# Patient Record
Sex: Female | Born: 1965 | Race: White | Hispanic: No | Marital: Married | Smoking: Former smoker
Health system: Southern US, Community
[De-identification: ages and names within clinical notes are randomized; demographics above are authoritative.]

## PROBLEM LIST (undated history)

## (undated) DIAGNOSIS — I10 Essential (primary) hypertension: Secondary | ICD-10-CM

## (undated) DIAGNOSIS — I7121 Aneurysm of the ascending aorta, without rupture: Secondary | ICD-10-CM

## (undated) DIAGNOSIS — E785 Hyperlipidemia, unspecified: Secondary | ICD-10-CM

## (undated) HISTORY — PX: TUBAL LIGATION: SHX77

## (undated) HISTORY — DX: Aneurysm of the ascending aorta, without rupture: I71.21

## (undated) HISTORY — DX: Essential (primary) hypertension: I10

## (undated) HISTORY — DX: Hyperlipidemia, unspecified: E78.5

## (undated) HISTORY — PX: NASAL SINUS SURGERY: SHX719

---

## 2005-04-14 ENCOUNTER — Ambulatory Visit: Payer: Self-pay | Admitting: Obstetrics and Gynecology

## 2005-11-22 ENCOUNTER — Ambulatory Visit: Payer: Self-pay | Admitting: Gastroenterology

## 2006-04-20 ENCOUNTER — Ambulatory Visit: Payer: Self-pay | Admitting: Obstetrics and Gynecology

## 2007-05-01 IMAGING — US ABDOMEN ULTRASOUND
1 series · 17 of 25 positions shown · non-contrast
Comparison: none

REASON FOR EXAM: RUQ pain  RLQ pain
COMMENTS:

[Series 1: abdomen ultrasound · 17 of 55 slices shown]
[im 1/55]
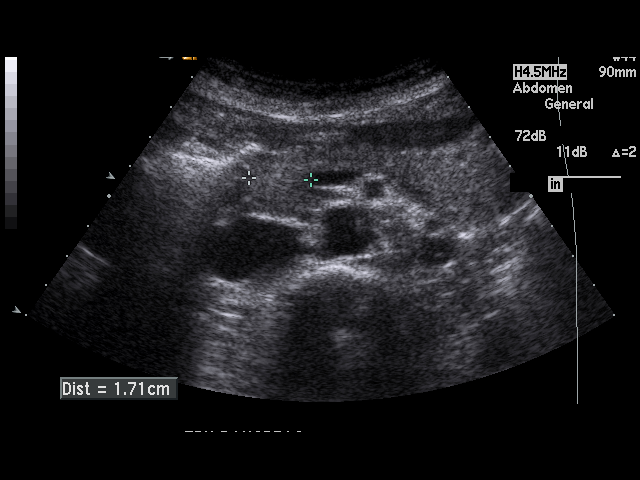
[im 5/55]
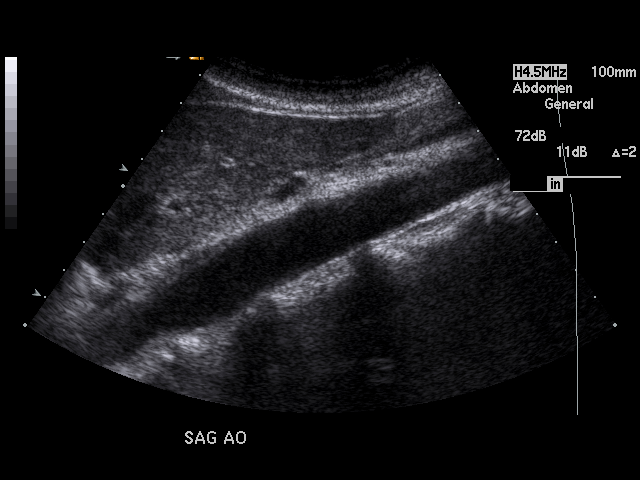
[im 7/55]
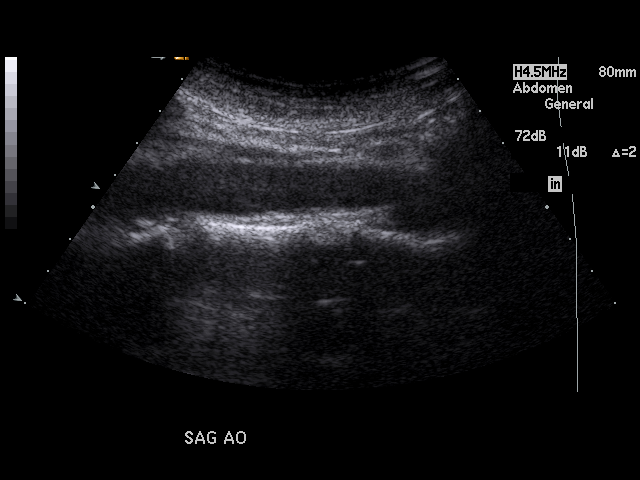
[im 12/55]
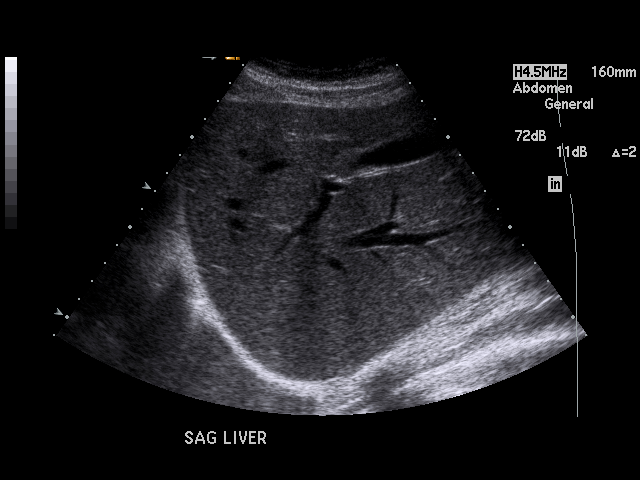
[im 14/55]
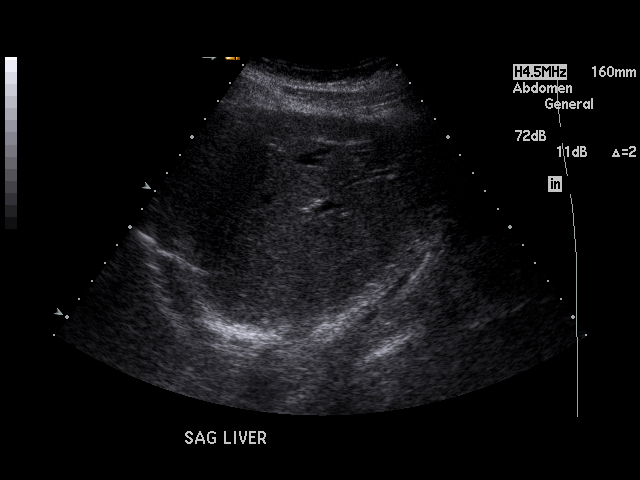
[im 19/55]
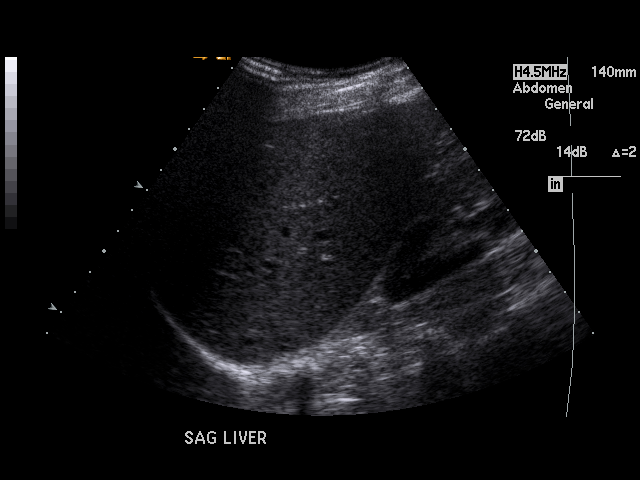
[im 21/55]
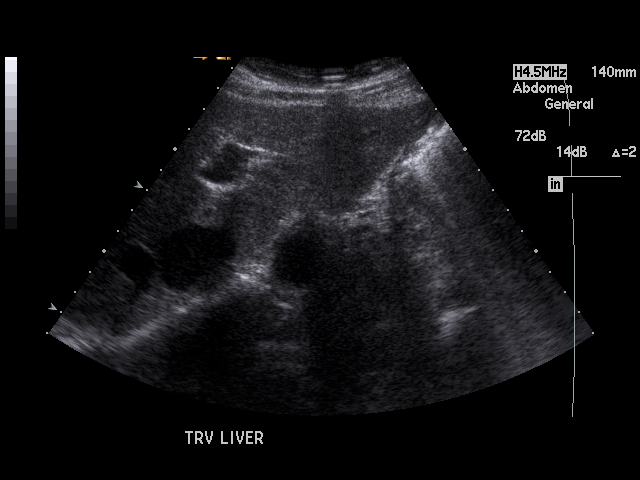
[im 25/55]
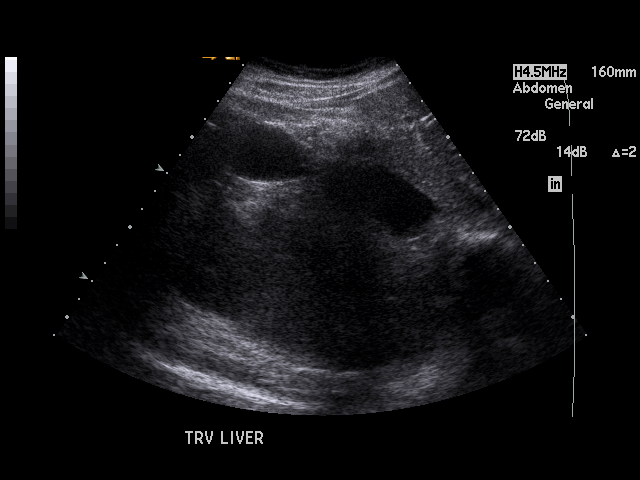
[im 28/55]
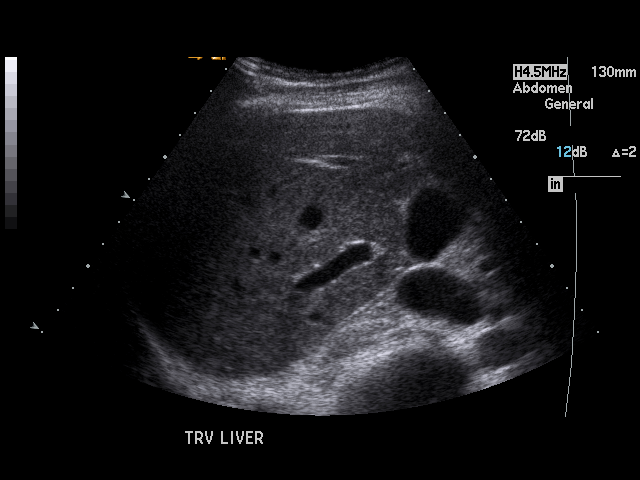
[im 30/55]
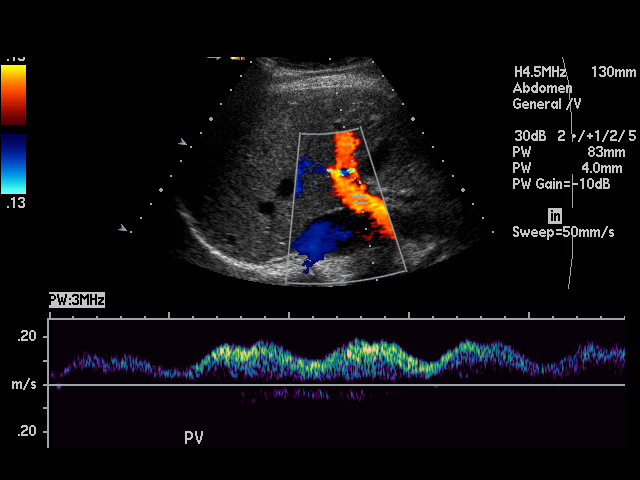
[im 34/55]
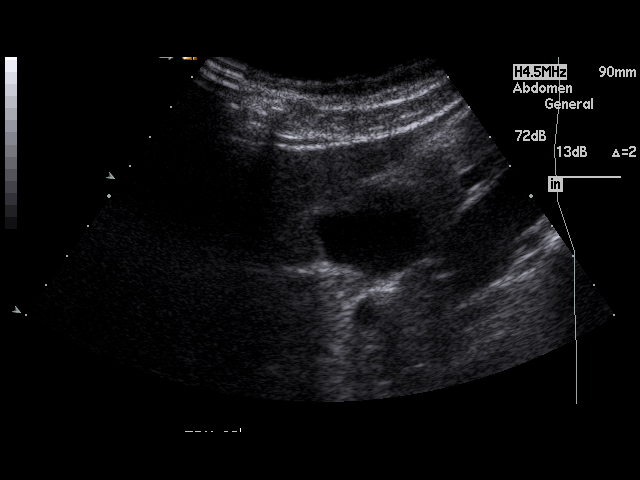
[im 37/55]
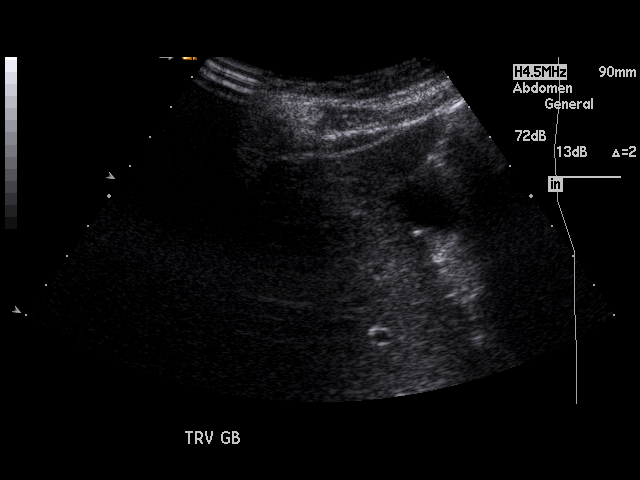
[im 41/55]
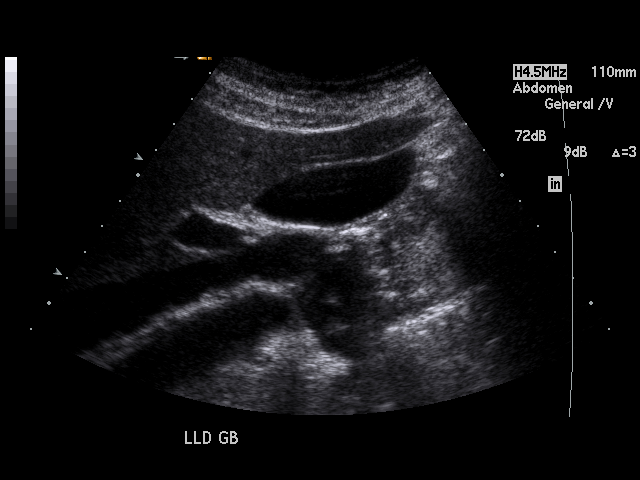
[im 43/55]
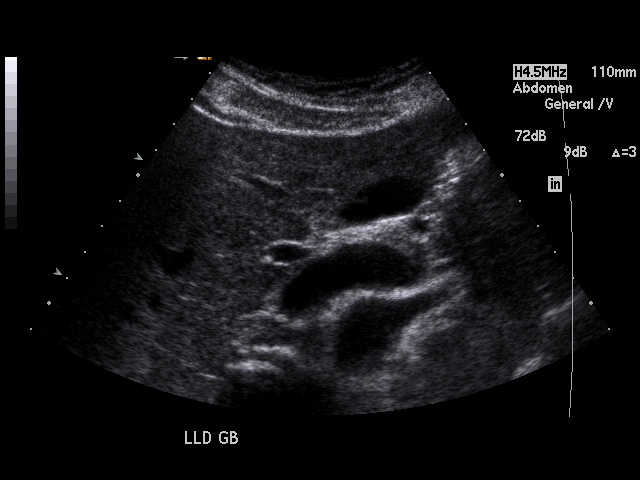
[im 48/55]
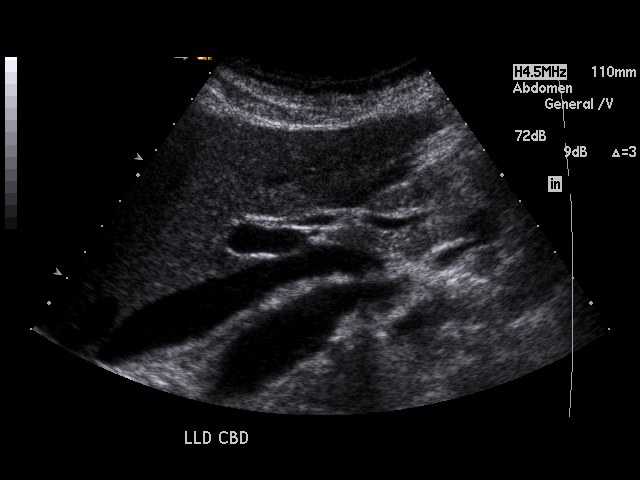
[im 50/55]
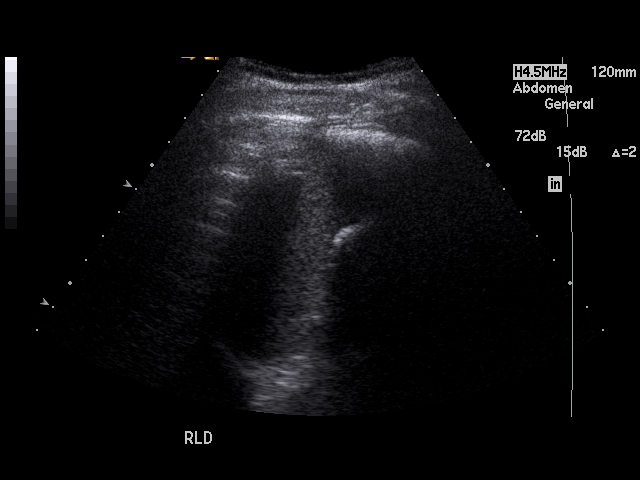
[im 55/55]
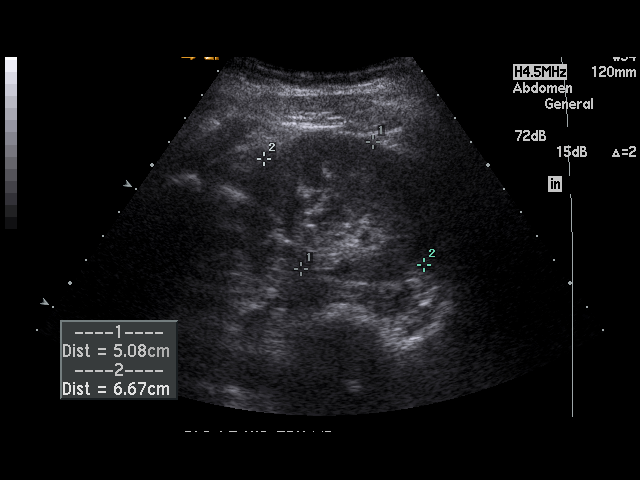

[17 of 25 positions shown; findings below may reference images not displayed]

PROCEDURE:     US  - US ABDOMEN GENERAL SURVEY  - November 22, 2005 [DATE]

RESULT:       The liver demonstrates a homogenous echotexture.  The common
bile duct measures 4.5 mm in diameter.  Gallbladder wall thickness is
mm.  There is no evidence of pericholecystic sludge, stones nor evidence of
a sonographic Murphy sign.  Evaluation of the RIGHT and LEFT kidneys
demonstrates no evidence of hydronephrosis, masses or calculi.   The RIGHT
kidney is 9.97 cm in longitudinal dimensions and the LEFT kidney 11.3 cm.
The spleen is homogenous in echotexture.  The pancreas is also homogenous in
echotexture.   Hepatopetal flow is demonstrated within the portal vein.
IMPRESSION: Unremarkable abdominal ultrasound as described above.
Note, the spleen is partially visualized secondary to bowel gas.

## 2008-05-11 ENCOUNTER — Ambulatory Visit: Payer: Self-pay | Admitting: Obstetrics and Gynecology

## 2009-08-02 ENCOUNTER — Ambulatory Visit: Payer: Self-pay | Admitting: Obstetrics and Gynecology

## 2010-09-01 ENCOUNTER — Ambulatory Visit: Payer: Self-pay | Admitting: Obstetrics and Gynecology

## 2011-12-12 ENCOUNTER — Ambulatory Visit: Payer: Self-pay | Admitting: Obstetrics and Gynecology

## 2012-02-08 IMAGING — MG MMM DGTL SCREENING MAMMO W/CAD
1 series · 4 of 4 positions shown · non-contrast
Comparison: none

REASON FOR EXAM: SCR
COMMENTS:

PROCEDURE:     MMM - MMM DGTL SCREENING MAMMO W/CAD  - September 01, 2010  [DATE]
RESULT:      Comparison is made to a prior study dated 08/02/2009 and
08/30/2007.
The breasts demonstrate a heterogenous parenchymal pattern.
There is no mammographic evidence to suggest malignancy.

[Series 7823: R CC · right · 4 of 4 slices shown]
[im 1/4]
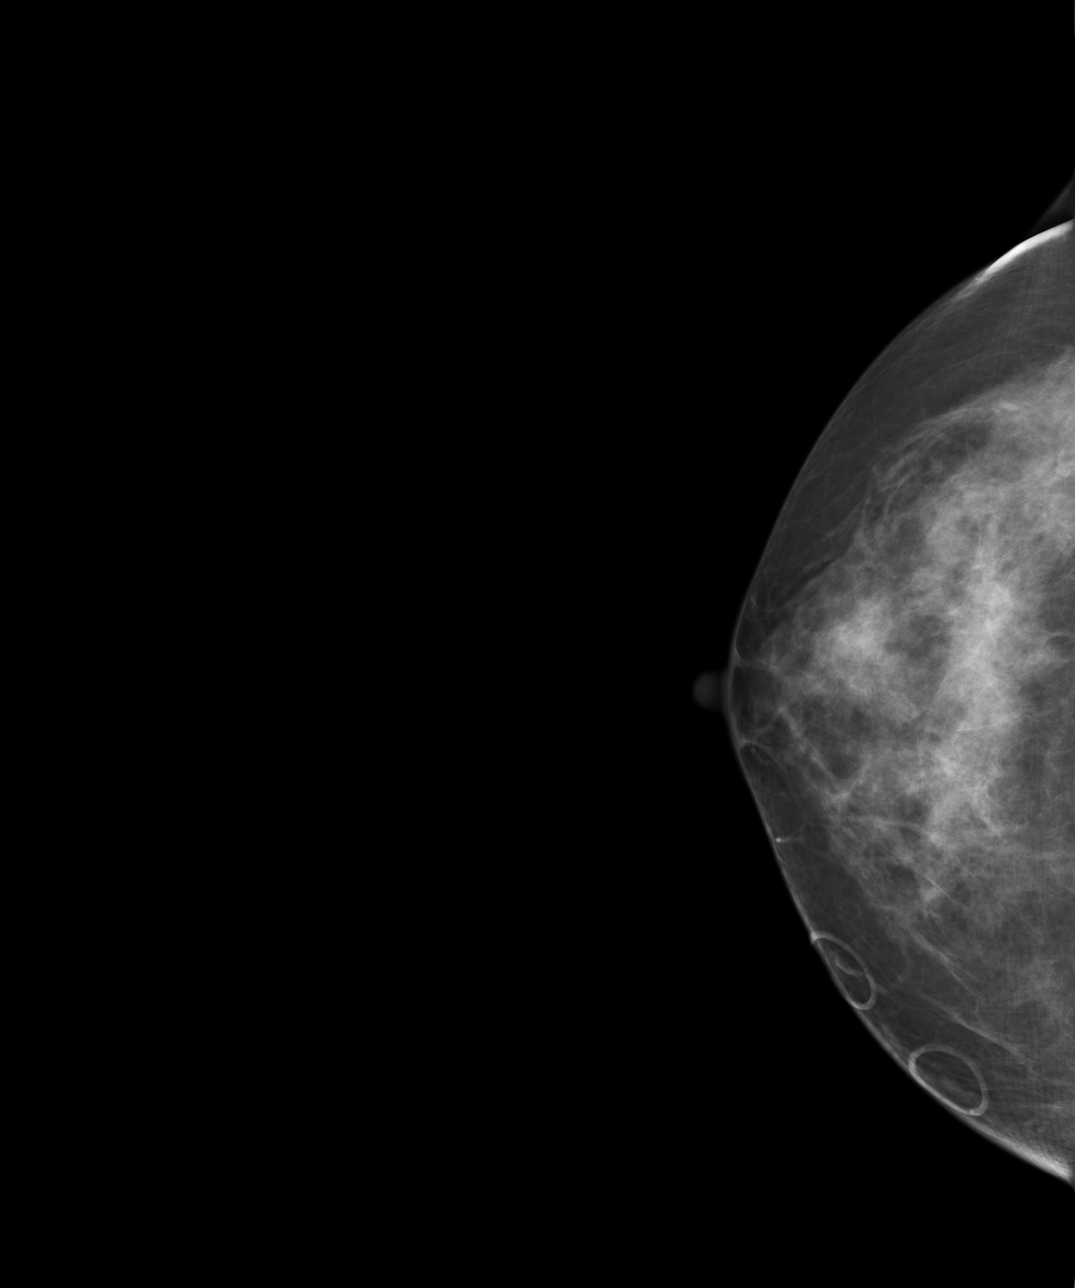
[im 2/4]
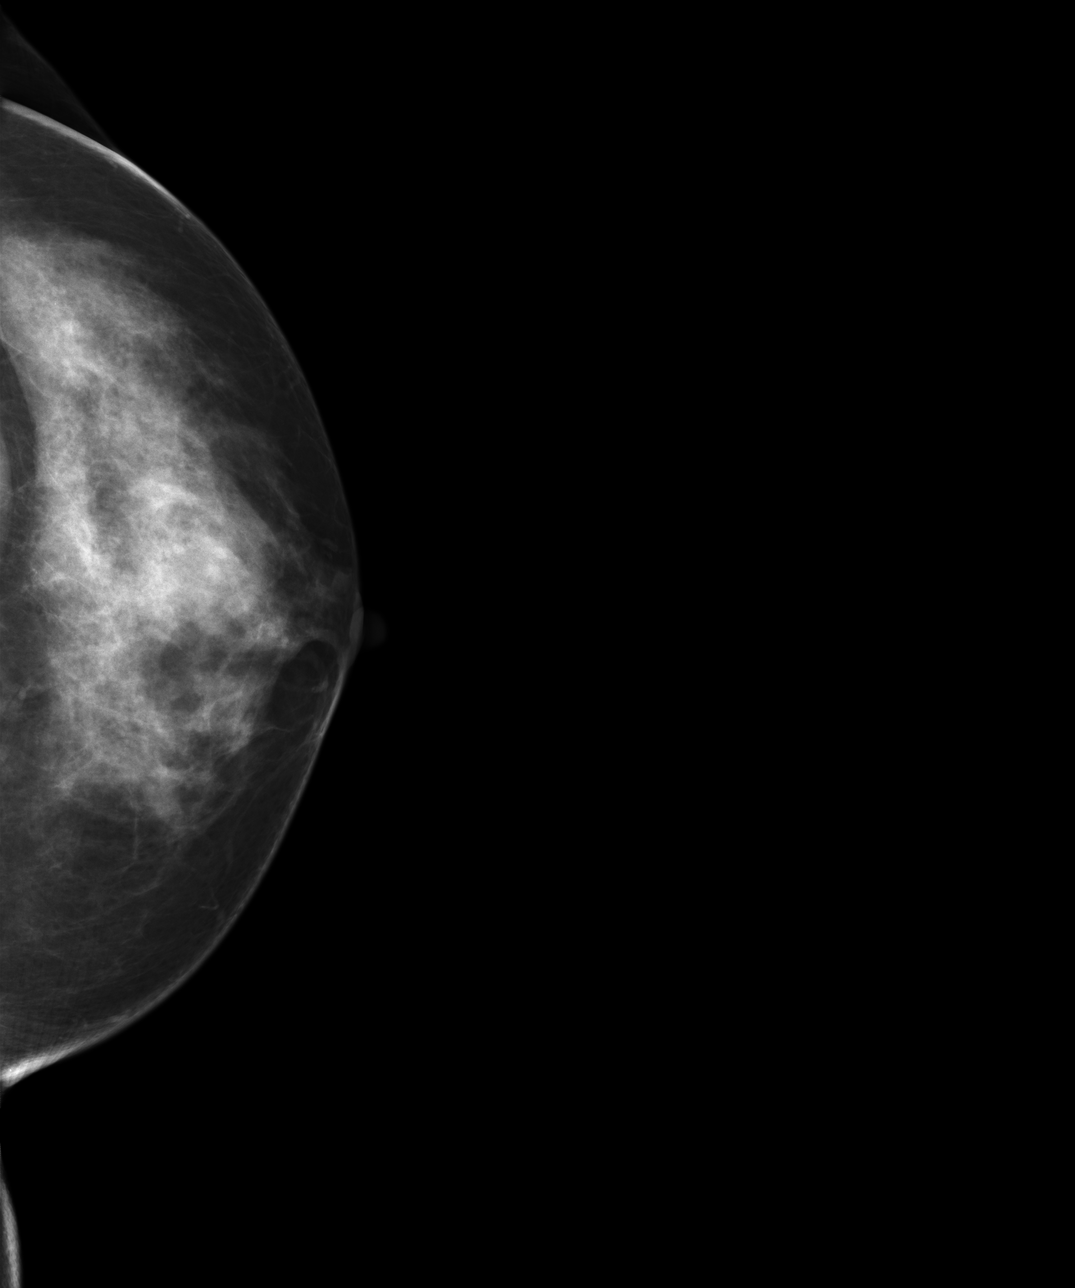
[im 3/4]
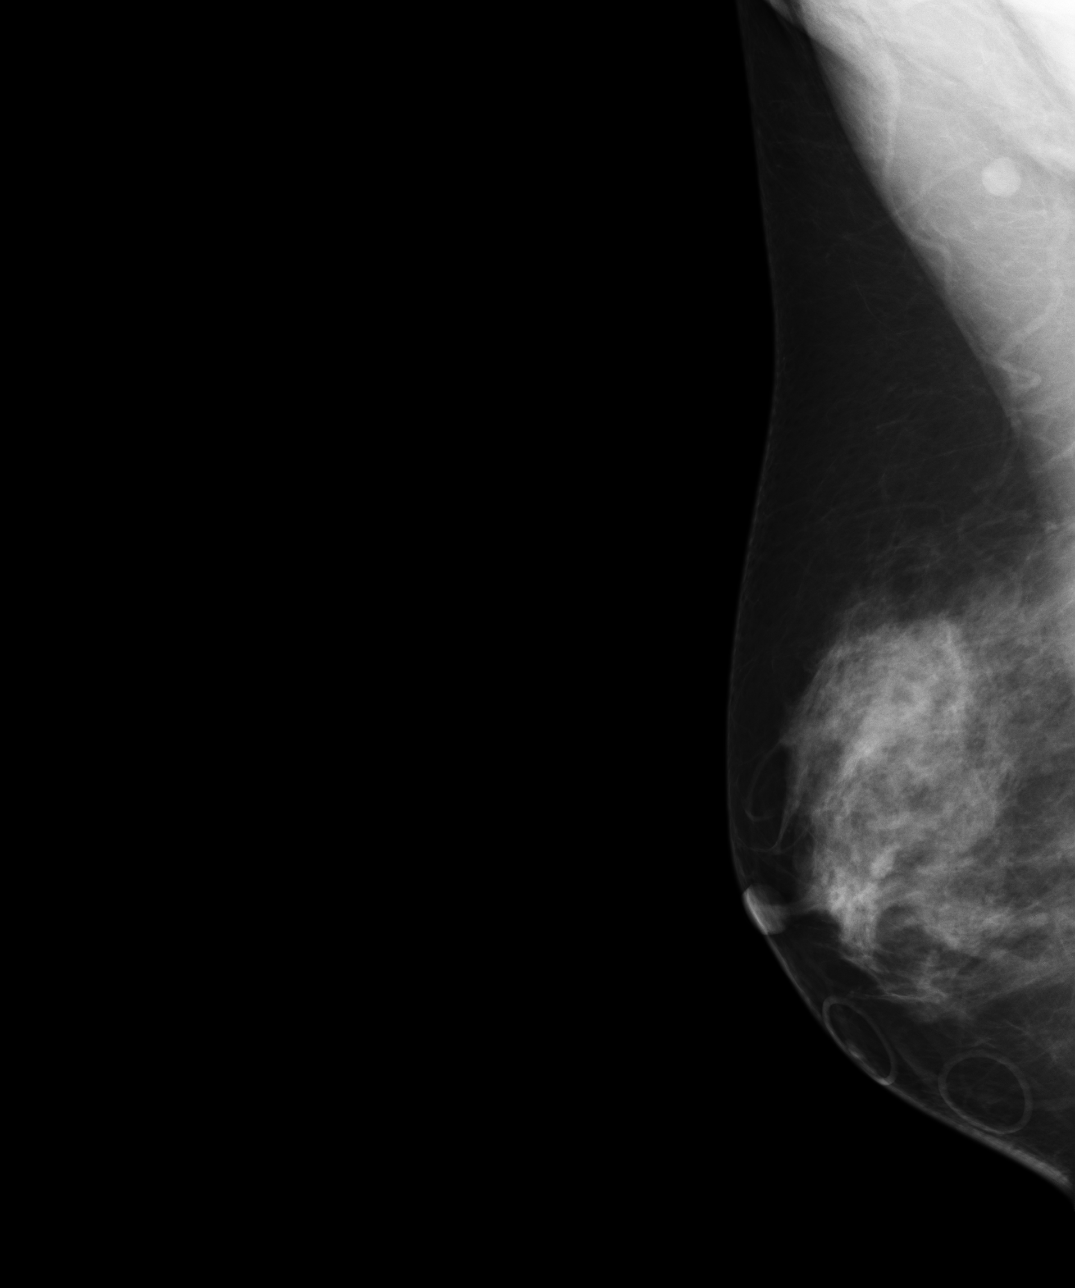
[im 4/4]
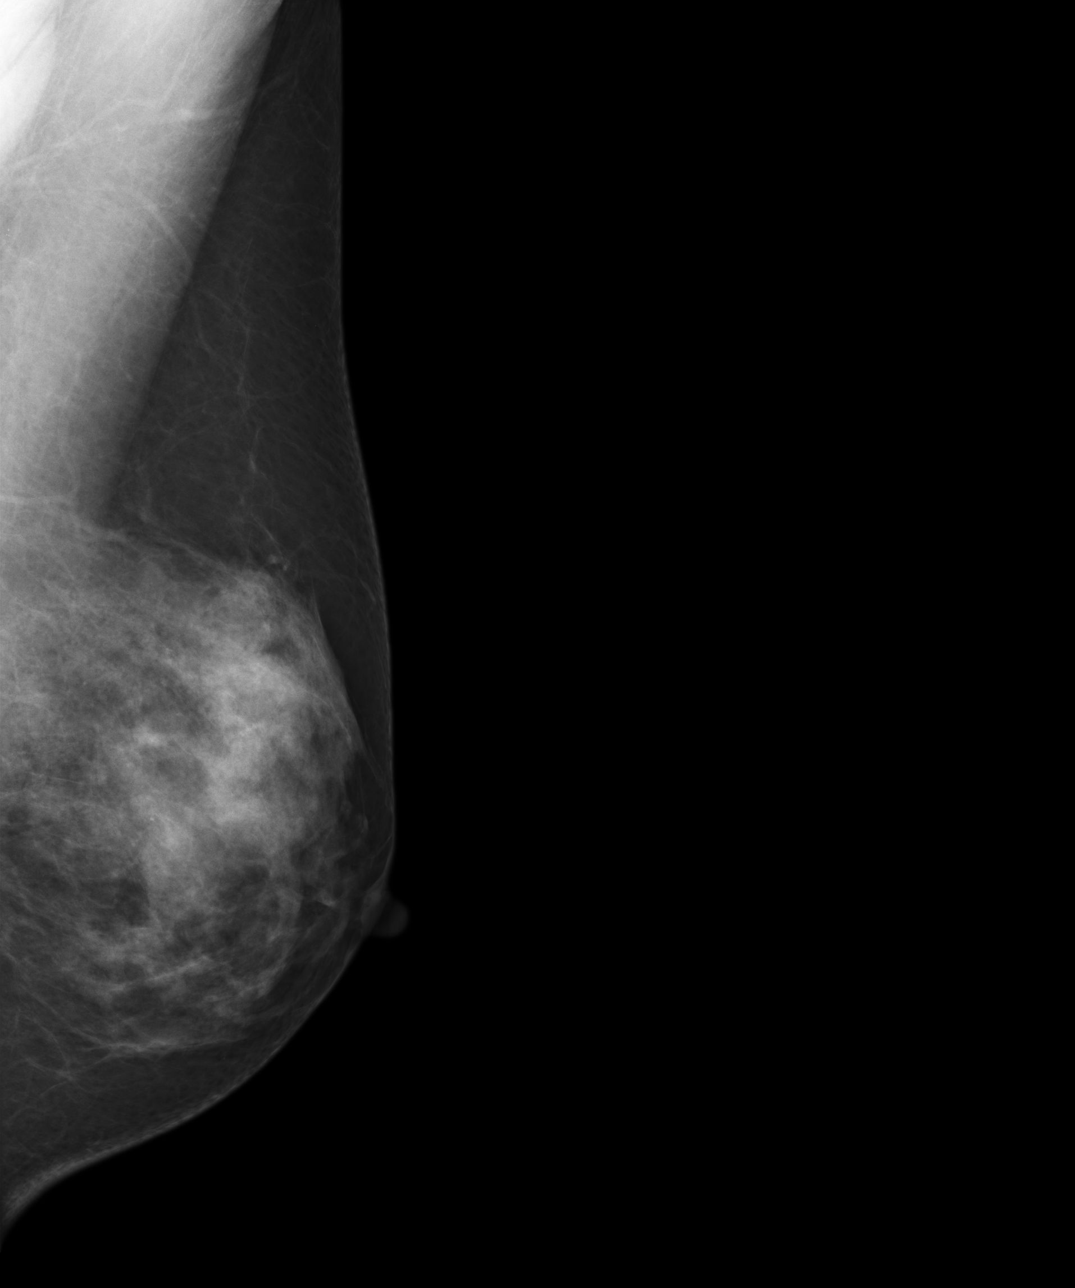

[4 of 4 positions shown; findings below may reference images not displayed]

IMPRESSION: BI-RADS:  Category 2- Benign Finding.

A negative mammogram report does not preclude biopsy or other evaluation of
a clinically palpable or otherwise suspicious mass or lesion. Breast cancer
may not be detected by mammography in up to 10% of cases.

## 2013-04-23 ENCOUNTER — Ambulatory Visit: Payer: Self-pay | Admitting: Obstetrics and Gynecology

## 2013-05-20 IMAGING — MG MMM DGT SCR NO ORDER W/CAD
1 series · 4 of 4 positions shown · non-contrast
Comparison: none

REASON FOR EXAM: SCR MAMMO NO ORDER
COMMENTS:

PROCEDURE:     MMM - MMM DGT SCR NO ORDER W/CAD  - December 12, 2011  [DATE]
RESULT:     No dominant masses or pathologic clustered calcifications
demonstrated. CAD evaluation is nonfocal. The breasts are dense.

[Series 7248: R CC · right · 4 of 4 slices shown]
[im 1/4]
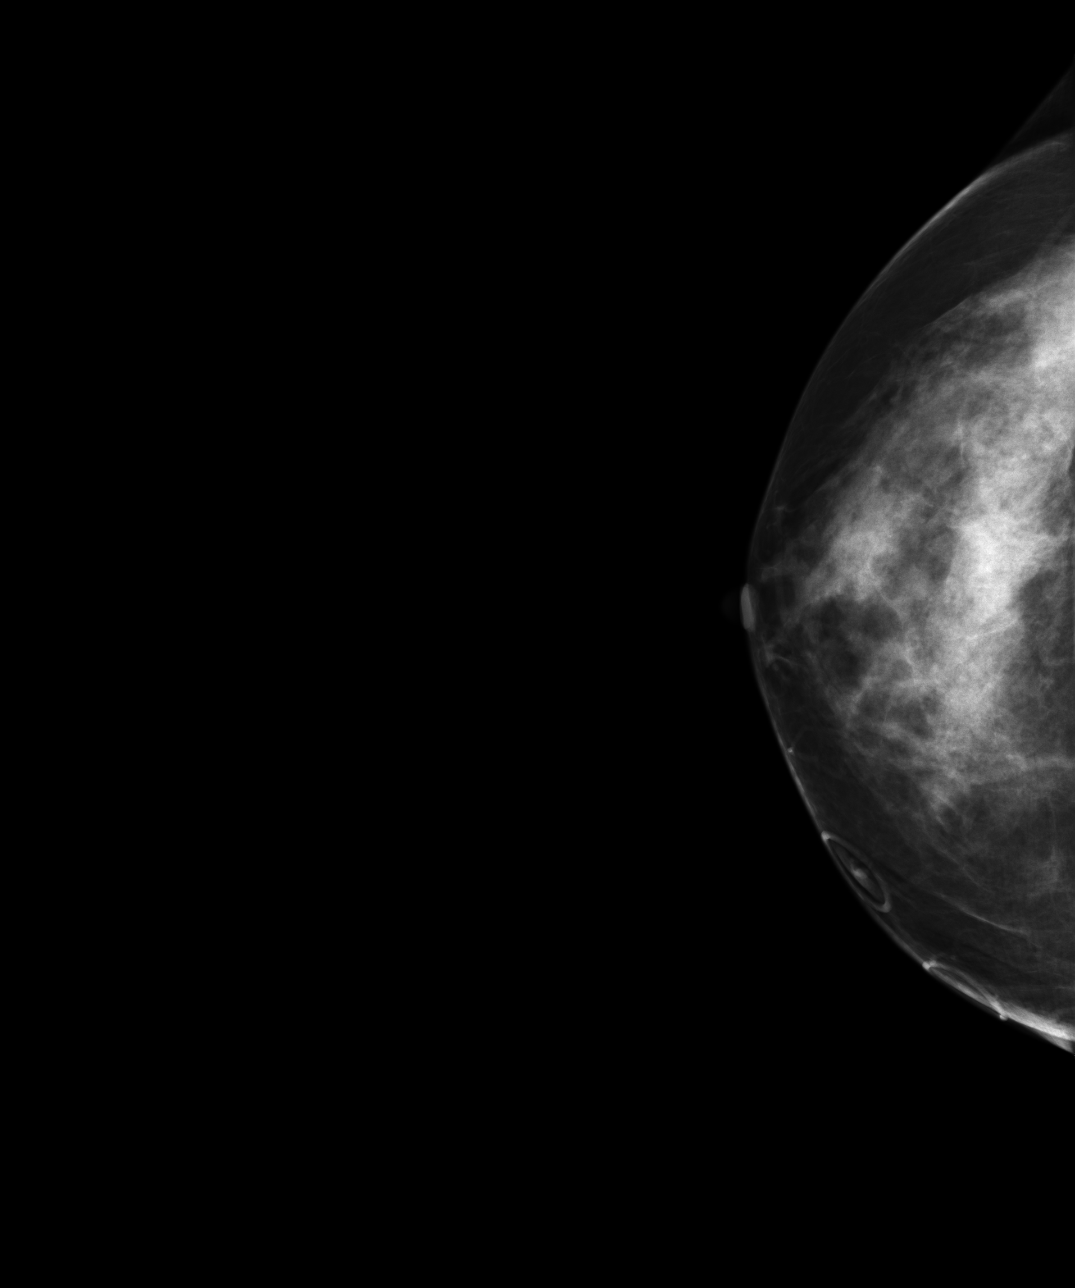
[im 2/4]
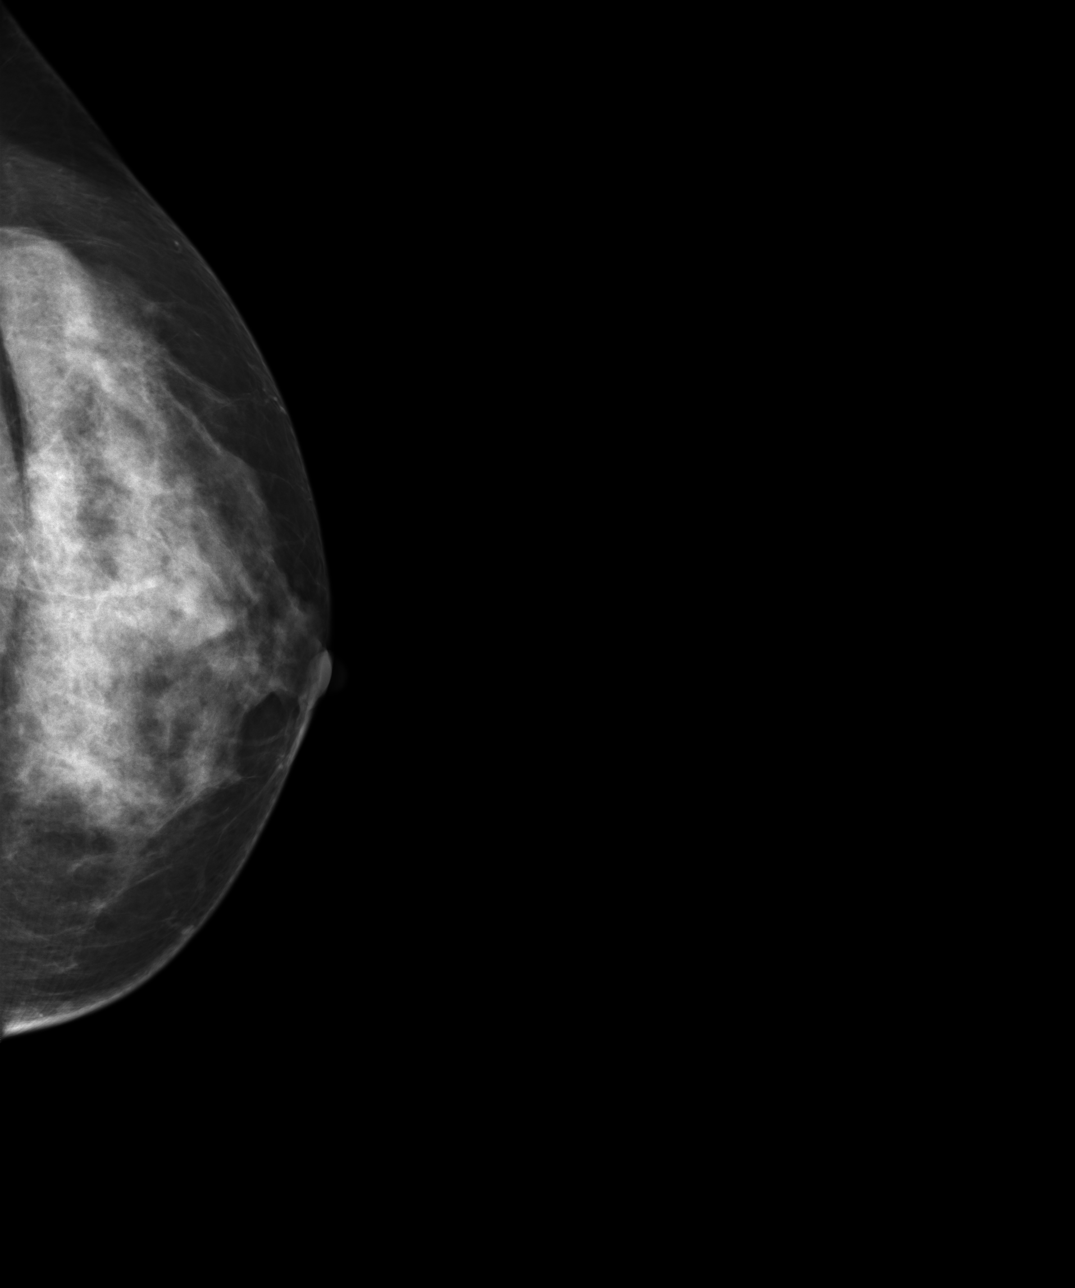
[im 3/4]
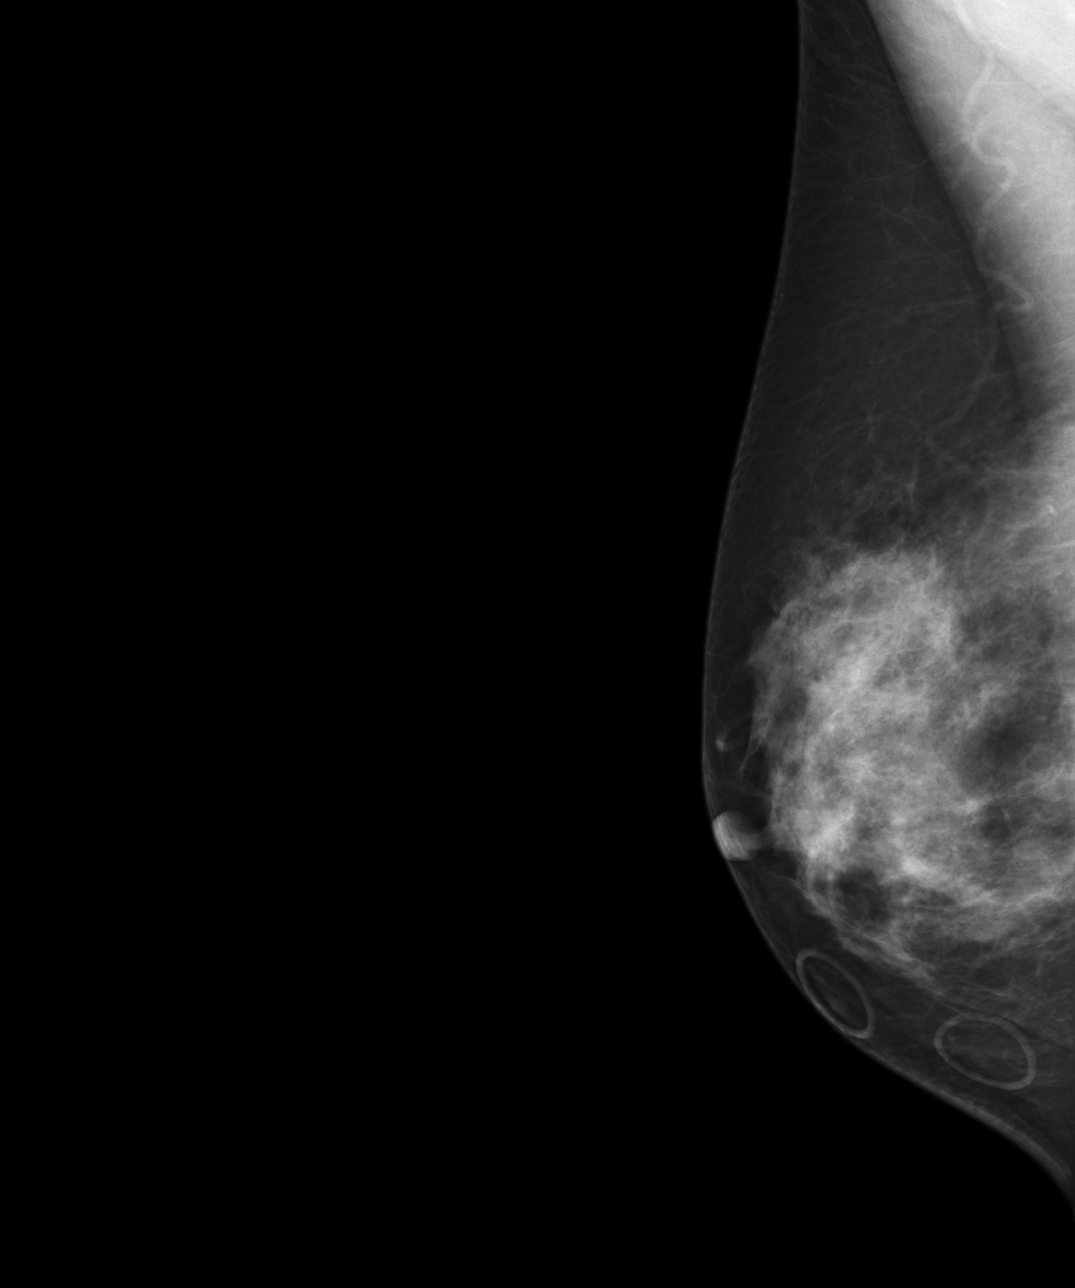
[im 4/4]
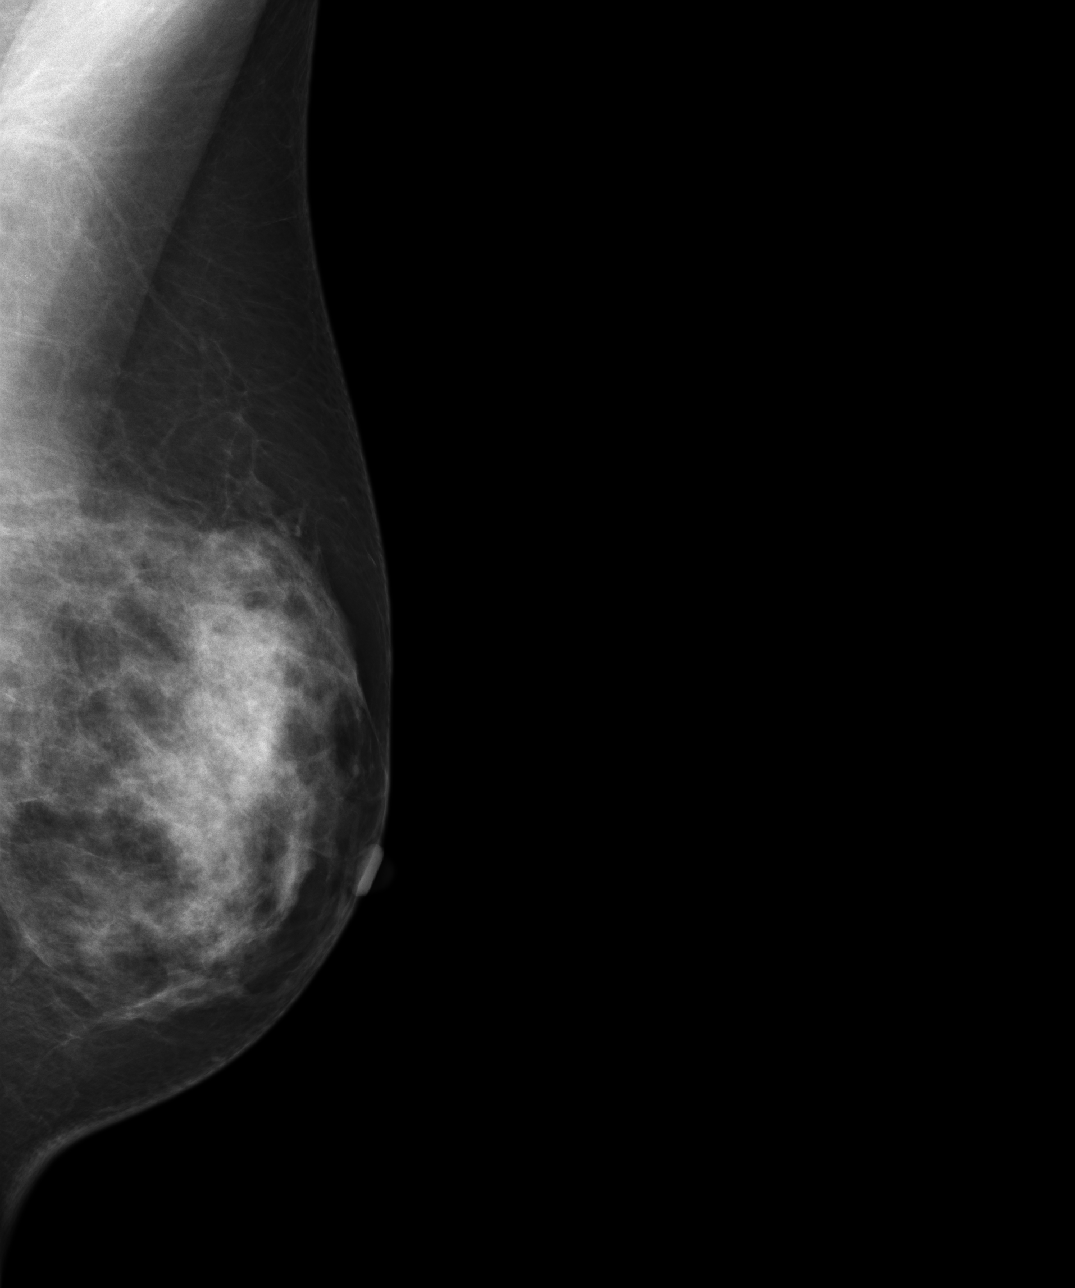

[4 of 4 positions shown; findings below may reference images not displayed]

IMPRESSION: Benign exam. Routine yearly follow-up mammograms suggested.

BI-RADS: Category 2 - Benign Findings

Thank you for this opportunity to contribute to the care of your patient.

A NEGATIVE MAMMOGRAM REPORT DOES NOT PRECLUDE BIOPSY OR OTHER EVALUATION OF
A CLINICALLY PALPABLE OR OTHERWISE SUSPICIOUS MASS OR LESION. BREAST CANCER
MAY NOT BE DETECTED BY MAMMOGRAPHY IN UP TO 10% OF CASES.

## 2014-09-30 IMAGING — MG MMM DGT SCR NO ORDER W/CAD
1 series · 4 of 4 positions shown · non-contrast
Comparison: Previous exam(s).

REASON FOR EXAM: SCR MAMMO NO ORDER
COMMENTS:

PROCEDURE:     MMM - MMM DGT SCR NO ORDER W/CAD  - April 23, 2013  [DATE]
CLINICAL DATA: Screening.
EXAM:
DIGITAL SCREENING BILATERAL MAMMOGRAM WITH CAD

[R CC · right · 4 of 4 slices shown]
[im 1/4]
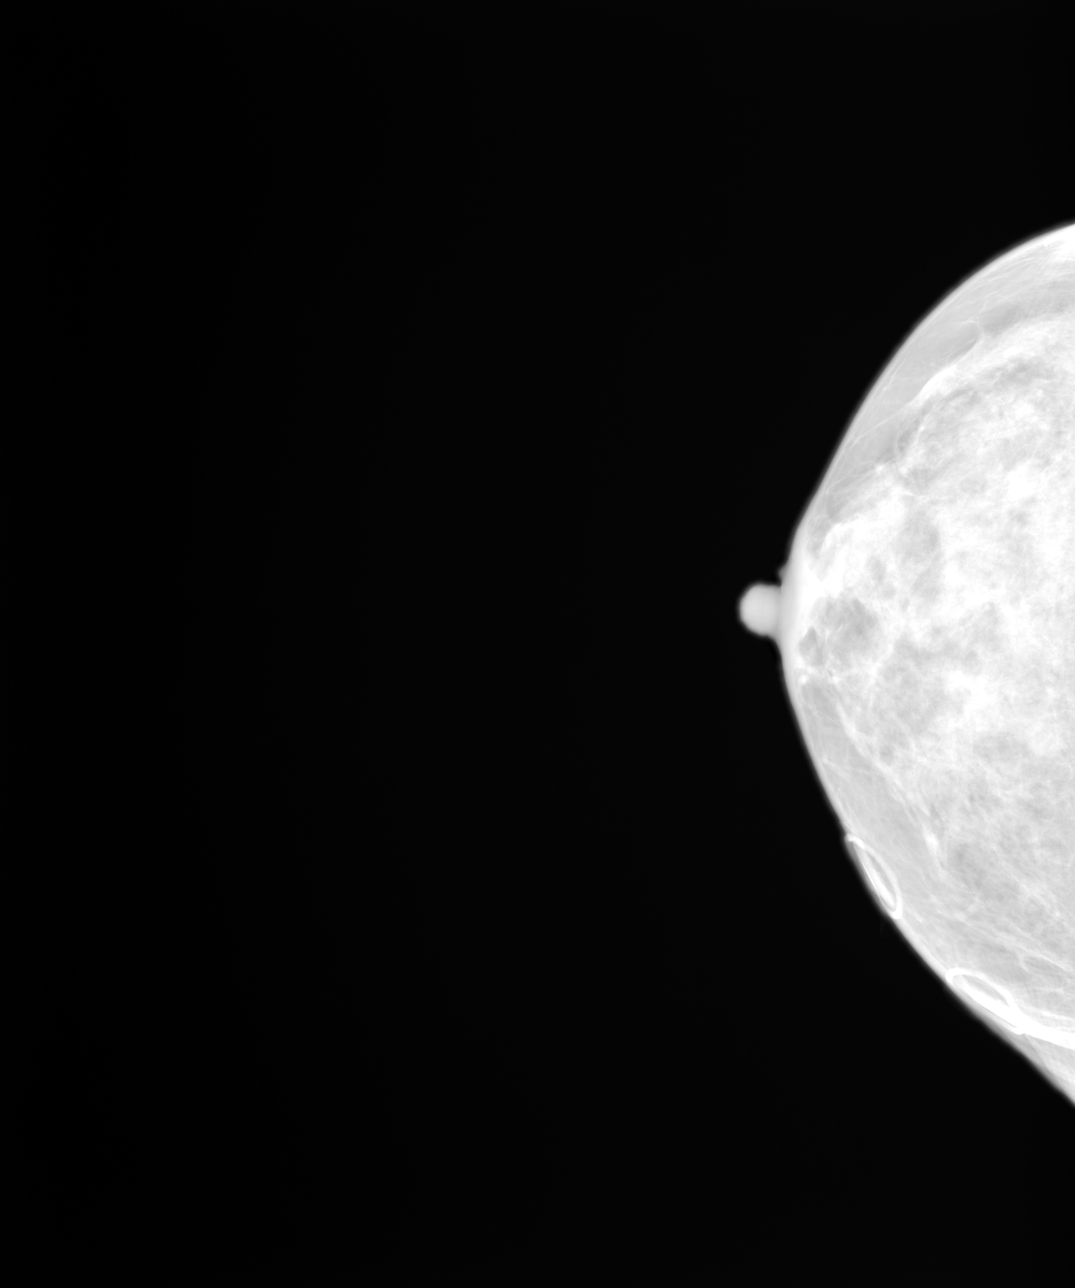
[im 2/4]
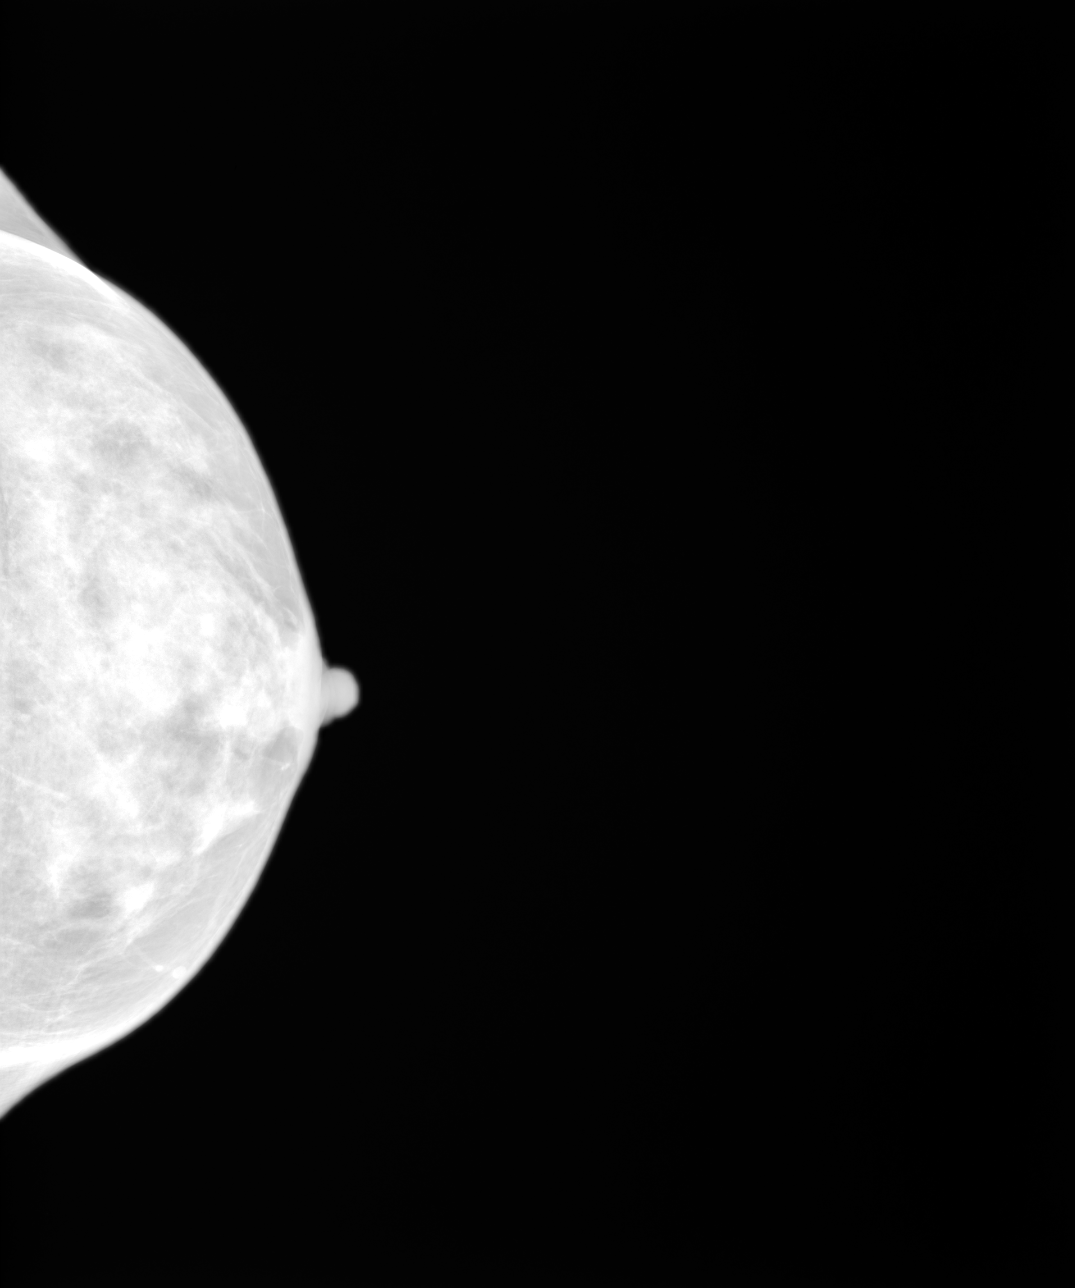
[im 3/4]
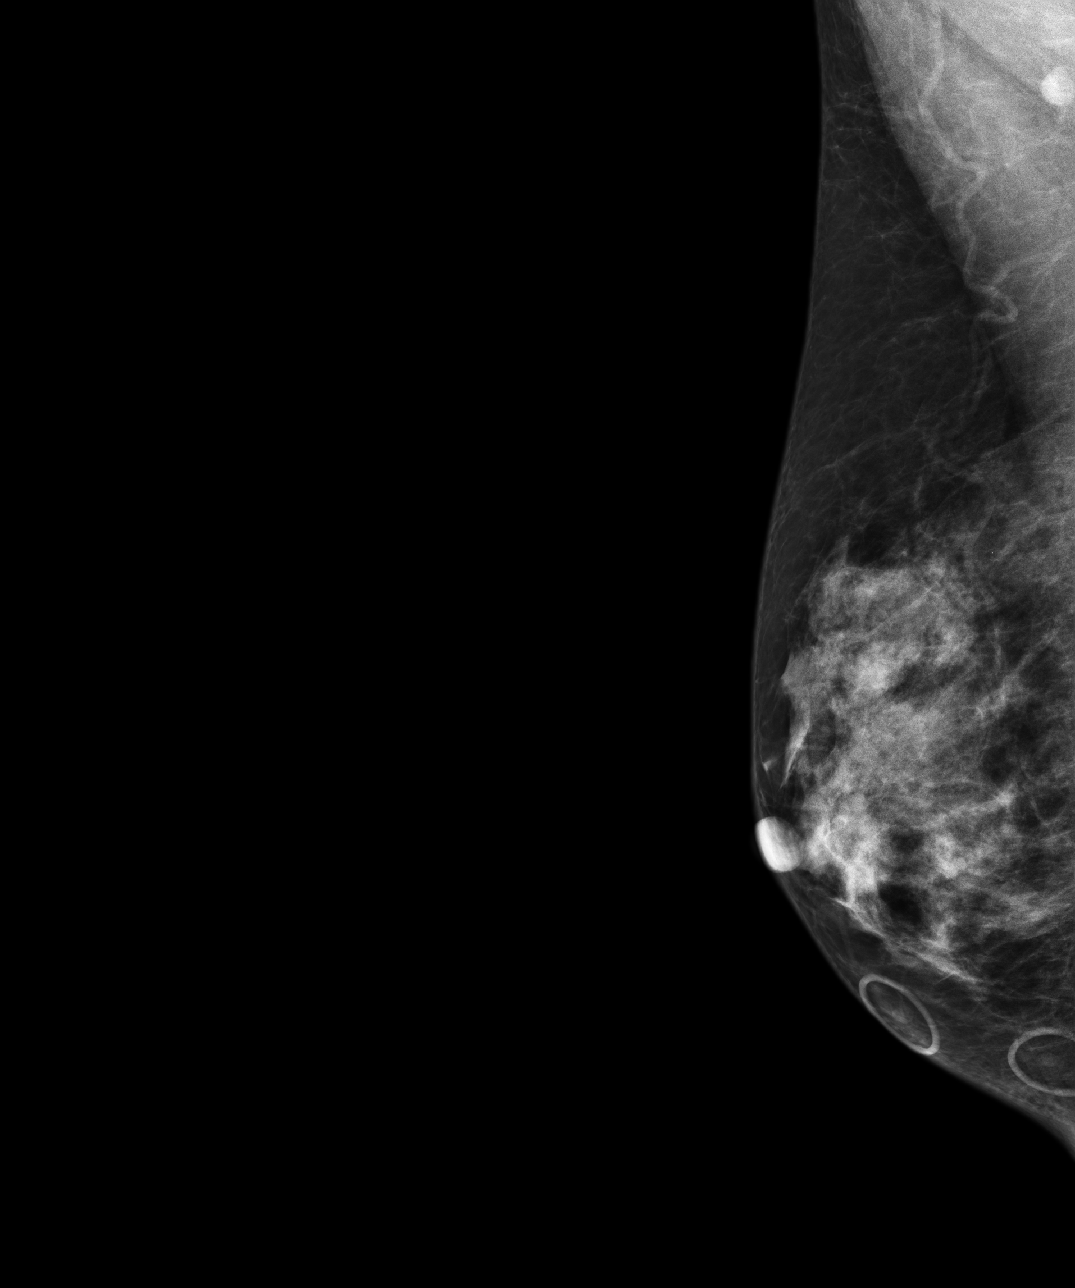
[im 4/4]
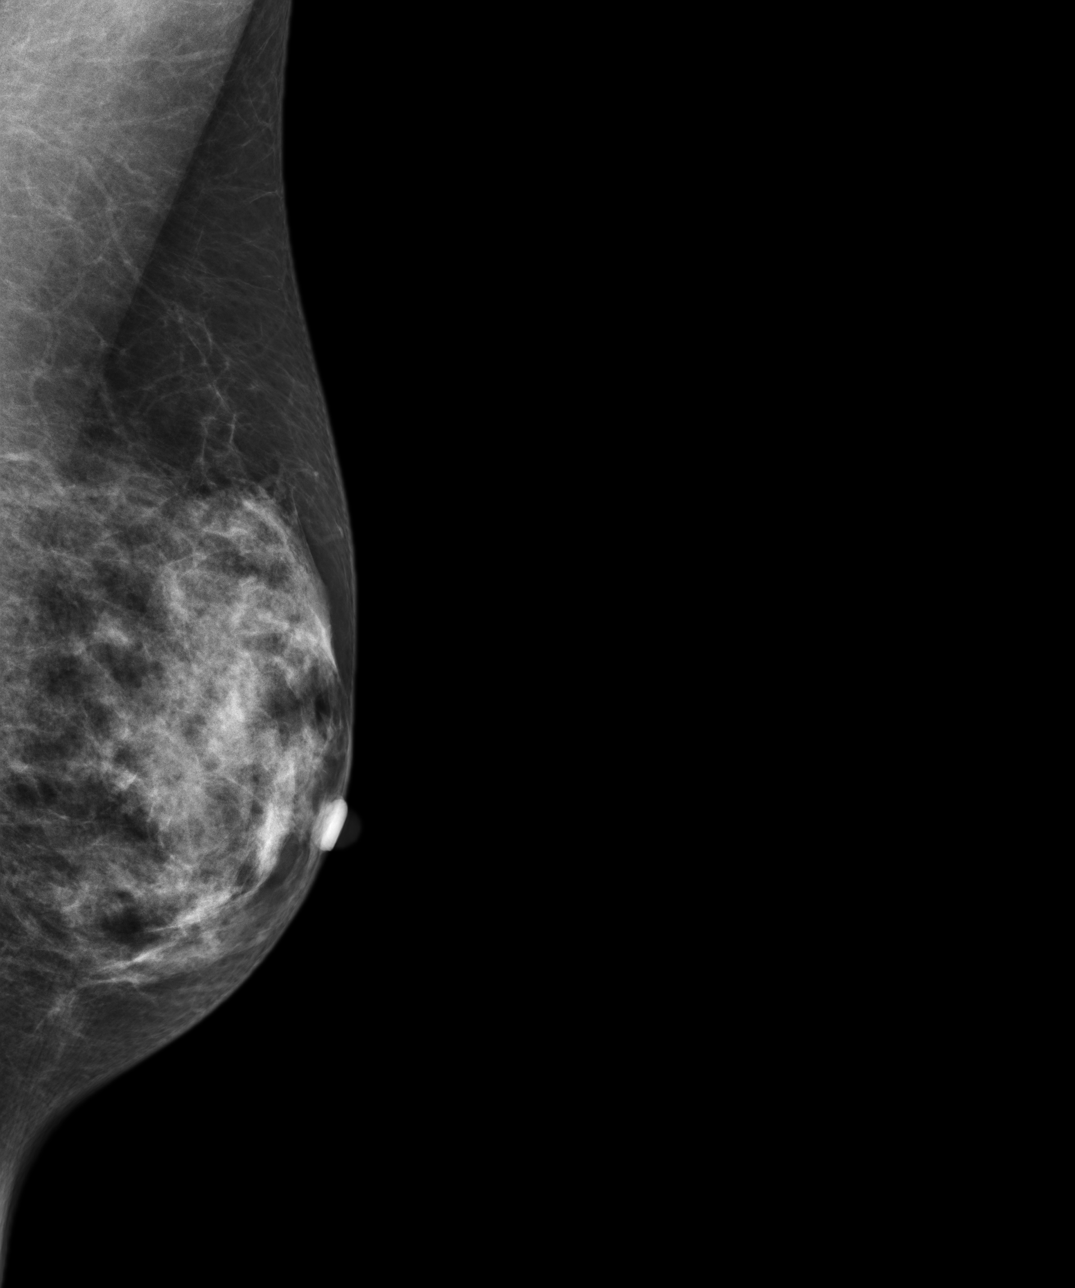

[4 of 4 positions shown; findings below may reference images not displayed]

ACR Breast Density Category d: The breasts are extremely dense,
which lowers the sensitivity of mammography.
FINDINGS: There are no findings suspicious for malignancy. Images were
processed with CAD.
IMPRESSION: No mammographic evidence of malignancy. A result letter of this
screening mammogram will be mailed directly to the patient.

RECOMMENDATION:
Screening mammogram in one year. (Code:K8-9-7SI)

BI-RADS CATEGORY  1: Negative

## 2014-12-14 ENCOUNTER — Other Ambulatory Visit: Payer: Self-pay

## 2014-12-14 DIAGNOSIS — G4700 Insomnia, unspecified: Secondary | ICD-10-CM | POA: Insufficient documentation

## 2014-12-14 DIAGNOSIS — F32A Depression, unspecified: Secondary | ICD-10-CM | POA: Insufficient documentation

## 2014-12-14 DIAGNOSIS — F329 Major depressive disorder, single episode, unspecified: Secondary | ICD-10-CM | POA: Insufficient documentation

## 2014-12-15 ENCOUNTER — Encounter: Payer: Self-pay | Admitting: Family Medicine

## 2014-12-15 ENCOUNTER — Ambulatory Visit (INDEPENDENT_AMBULATORY_CARE_PROVIDER_SITE_OTHER): Payer: BLUE CROSS/BLUE SHIELD | Admitting: Family Medicine

## 2014-12-15 VITALS — BP 120/80 | HR 68 | Ht 63.0 in | Wt 145.0 lb

## 2014-12-15 DIAGNOSIS — R002 Palpitations: Secondary | ICD-10-CM | POA: Diagnosis not present

## 2014-12-15 DIAGNOSIS — Z01818 Encounter for other preprocedural examination: Secondary | ICD-10-CM | POA: Diagnosis not present

## 2014-12-15 NOTE — Progress Notes (Signed)
Name: Sonya Howell   MRN: [id number]    DOB: 12/21/1965   Date:12/15/2014       Progress Note  Subjective  Chief Complaint  Chief Complaint  Patient presents with  . Palpitations    last episode was 2 months ago- needs surg clearance for colonoscopy    Palpitations  This is a new problem. The current episode started more than 1 year ago. The problem occurs intermittently. The problem has been waxing and waning. Episode Length: max 15 min. Nothing aggravates the symptoms. Pertinent negatives include no anxiety, chest fullness, chest pain, coughing, diaphoresis, dizziness, fever, irregular heartbeat or malaise/fatigue. She has tried nothing for the symptoms. There are no known risk factors. There is no history of anxiety or hyperthyroidism. 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   No past medical history on file.  History reviewed. No pertinent past surgical history.  No family history on file.  History   Social History  . Marital Status: Married    Spouse Name: N/A  . Number of Children: N/A  . Years of Education: N/A   Occupational History  . Not on file.   Social History Main Topics  . Smoking status: Former Games developermoker  . Smokeless tobacco: Not on file  . Alcohol Use: 0.0 oz/week    0 Standard drinks or equivalent per week  . Drug Use: No  . Sexual Activity: Not on file   Other Topics Concern  . Not on file   Social History Narrative  . No narrative on file    Allergies  Allergen Reactions  . Penicillins      Review of Systems  Constitutional: Negative for fever, chills, weight loss, malaise/fatigue and diaphoresis.  HENT: Negative for congestion, nosebleeds and sore throat.   Eyes: Negative for blurred vision.  Respiratory: Negative.  Negative for cough and wheezing.   Cardiovascular: Positive for palpitations. Negative for chest pain, orthopnea and leg swelling.  Gastrointestinal: Negative.   Negative for heartburn, abdominal pain and blood in stool.  Genitourinary: Negative.  Negative for dysuria, urgency and frequency.  Musculoskeletal: Negative for myalgias.  Neurological: Negative for dizziness and headaches.  Endo/Heme/Allergies: Negative for polydipsia. Does not bruise/bleed easily.  Psychiatric/Behavioral: Negative for depression. The patient is not nervous/anxious.      Objective  Filed Vitals:   12/15/14 1104  BP: 120/80  Pulse: 68  Height: 5\' 3"  (1.6 m)  Weight: 145 lb (65.772 kg)    Physical Exam  Constitutional: She is well-developed, well-nourished, and in no distress.  HENT:  Head: Normocephalic and atraumatic.  Right Ear: External ear normal.  Left Ear: External ear normal.  Eyes: Conjunctivae and EOM are normal. Pupils are equal, round, and reactive to light.  Neck: Normal range of motion. Neck supple. No thyromegaly present.  Cardiovascular: Normal rate, regular rhythm, S1 normal, S2 normal, intact distal pulses and normal pulses.  Exam reveals no gallop.   Murmur heard.  Systolic murmur is present with a grade of 1/6  C/w flow   Lymphadenopathy:    She has no cervical adenopathy.      No results found for this or any previous visit (from the past 2160 hour(s)).   Assessment & Plan  Problem List Items Addressed This Visit    None    Visit Diagnoses    Preop examination    -  Primary    no contraindication to surgical procedure  Palpitations        Relevant Orders    EKG 12-Lead (Completed)         Dr. Elizabeth Sauer [facility]  12/15/2014

## 2014-12-22 NOTE — Discharge Instructions (Signed)

## 2014-12-23 ENCOUNTER — Encounter: Payer: Self-pay | Admitting: *Deleted

## 2014-12-23 ENCOUNTER — Ambulatory Visit: Payer: BLUE CROSS/BLUE SHIELD | Admitting: Anesthesiology

## 2014-12-23 ENCOUNTER — Encounter
Admission: RE | Disposition: A | Discharge status: Home / Self Care | Payer: Self-pay | Source: Ambulatory Visit | Attending: Gastroenterology

## 2014-12-23 ENCOUNTER — Ambulatory Visit
Admission: RE | Admit: 2014-12-23 | Discharge: 2014-12-23 | Disposition: A | Discharge status: Home / Self Care | Payer: BLUE CROSS/BLUE SHIELD | Source: Ambulatory Visit | Attending: Gastroenterology | Admitting: Gastroenterology

## 2014-12-23 DIAGNOSIS — D649 Anemia, unspecified: Secondary | ICD-10-CM | POA: Diagnosis not present

## 2014-12-23 DIAGNOSIS — F329 Major depressive disorder, single episode, unspecified: Secondary | ICD-10-CM | POA: Diagnosis not present

## 2014-12-23 DIAGNOSIS — K5900 Constipation, unspecified: Secondary | ICD-10-CM | POA: Insufficient documentation

## 2014-12-23 DIAGNOSIS — Z79899 Other long term (current) drug therapy: Secondary | ICD-10-CM | POA: Insufficient documentation

## 2014-12-23 DIAGNOSIS — L409 Psoriasis, unspecified: Secondary | ICD-10-CM | POA: Insufficient documentation

## 2014-12-23 DIAGNOSIS — K589 Irritable bowel syndrome without diarrhea: Secondary | ICD-10-CM | POA: Insufficient documentation

## 2014-12-23 DIAGNOSIS — K649 Unspecified hemorrhoids: Secondary | ICD-10-CM | POA: Insufficient documentation

## 2014-12-23 DIAGNOSIS — E032 Hypothyroidism due to medicaments and other exogenous substances: Secondary | ICD-10-CM | POA: Insufficient documentation

## 2014-12-23 DIAGNOSIS — R140 Abdominal distension (gaseous): Secondary | ICD-10-CM | POA: Insufficient documentation

## 2014-12-23 DIAGNOSIS — T50905A Adverse effect of unspecified drugs, medicaments and biological substances, initial encounter: Secondary | ICD-10-CM | POA: Insufficient documentation

## 2014-12-23 HISTORY — PX: COLONOSCOPY: SHX5424

## 2014-12-23 SURGERY — COLONOSCOPY
Anesthesia: Monitor Anesthesia Care | Wound class: Contaminated

## 2014-12-23 MED ORDER — SODIUM CHLORIDE 0.9 % IV SOLN: INTRAVENOUS | Status: DC

## 2014-12-23 MED ORDER — LACTATED RINGERS IV SOLN
INTRAVENOUS | Status: DC
  Administered 2014-12-23 (×2): via INTRAVENOUS

## 2014-12-23 MED ORDER — SIMETHICONE 40 MG/0.6ML PO SUSP
ORAL | Status: DC | PRN
  Administered 2014-12-23: 10:00:00

## 2014-12-23 MED ORDER — PROPOFOL 10 MG/ML IV BOLUS
INTRAVENOUS | Status: DC | PRN
  Administered 2014-12-23 (×10): 20 mg via INTRAVENOUS

## 2014-12-23 MED ORDER — LIDOCAINE HCL (CARDIAC) 20 MG/ML IV SOLN
INTRAVENOUS | Status: DC | PRN
  Administered 2014-12-23: 30 mg via INTRAVENOUS

## 2014-12-23 MED ORDER — GLYCOPYRROLATE 0.2 MG/ML IJ SOLN
INTRAMUSCULAR | Status: DC | PRN
  Administered 2014-12-23: 0.2 mg via INTRAVENOUS

## 2014-12-23 SURGICAL SUPPLY — 30 items

## 2014-12-23 NOTE — Transfer of Care (Signed)
Immediate Anesthesia Transfer of Care Note  Patient: Sonya Howell  Procedure(s) Performed: Procedure(s): COLONOSCOPY (N/A)  Patient Location: PACU  Anesthesia Type: MAC  Level of Consciousness: awake, alert  and patient cooperative  Airway and Oxygen Therapy: Patient Spontanous Breathing and Patient connected to supplemental oxygen  Post-op Assessment: Post-op Vital signs reviewed, Patient's Cardiovascular Status Stable, Respiratory Function Stable, Patent Airway and No signs of Nausea or vomiting  Post-op Vital Signs: Reviewed and stable  Complications: No apparent anesthesia complications

## 2014-12-23 NOTE — H&P (Signed)
  Date of Initial H&P:12/03/2014  History reviewed, patient examined, no change in status, stable for surgery. 

## 2014-12-23 NOTE — Anesthesia Postprocedure Evaluation (Signed)
  Anesthesia Post-op Note  Patient: Sonya Howell  Procedure(s) Performed: Procedure(s): COLONOSCOPY (N/A)  Anesthesia type:MAC  Patient location: PACU  Post pain: Pain level controlled  Post assessment: Post-op Vital signs reviewed, Patient's Cardiovascular Status Stable, Respiratory Function Stable, Patent Airway and No signs of Nausea or vomiting  Post vital signs: Reviewed and stable  Last Vitals:  Filed Vitals:   12/23/14 1013  BP:   Pulse: 84  Temp: 36.8 C  Resp: 18    Level of consciousness: awake, alert  and patient cooperative  Complications: No apparent anesthesia complications

## 2014-12-23 NOTE — Anesthesia Preprocedure Evaluation (Signed)
Anesthesia Evaluation  Patient identified by MRN, date of birth, ID band Patient awake    Reviewed: Allergy & Precautions, NPO status , Patient's Chart, lab work & pertinent test results  Airway Mallampati: II  TM Distance: >3 FB Neck ROM: Full    Dental no notable dental hx.    Pulmonary neg pulmonary ROS, former smoker,  breath sounds clear to auscultation  Pulmonary exam normal       Cardiovascular negative cardio ROS Normal cardiovascular examRhythm:Regular Rate:Normal     Neuro/Psych negative neurological ROS  negative psych ROS   GI/Hepatic negative GI ROS, Neg liver ROS,   Endo/Other  negative endocrine ROSHypothyroidism   Renal/GU negative Renal ROS  negative genitourinary   Musculoskeletal negative musculoskeletal ROS (+)   Abdominal   Peds negative pediatric ROS (+)  Hematology negative hematology ROS (+)   Anesthesia Other Findings   Reproductive/Obstetrics negative OB ROS                             Anesthesia Physical Anesthesia Plan  ASA: II  Anesthesia Plan: MAC   Post-op Pain Management:    Induction: Intravenous  Airway Management Planned:   Additional Equipment:   Intra-op Plan:   Post-operative Plan: Extubation in OR  Informed Consent: I have reviewed the patients History and Physical, chart, labs and discussed the procedure including the risks, benefits and alternatives for the proposed anesthesia with the patient or authorized representative who has indicated his/her understanding and acceptance.   Dental advisory given  Plan Discussed with: CRNA  Anesthesia Plan Comments:         Anesthesia Quick Evaluation

## 2014-12-23 NOTE — Op Note (Signed)
[facility]astroenterology Patient Name: Sonya Howell Procedure Date: 12/23/2014 9:35 AM MRN: [id number] Account #: [id number] Date of Birth: March 08, 1966 Admit Type: Outpatient Age: 49 Room: [facility] OR ROOM 01 Gender: Female Note Status: Finalized Procedure:         Colonoscopy Indications:       Screening for colorectal malignant neoplasm Providers:         Ezzard Standing. Bluford Kaufmann, MD Medicines:         Monitored Anesthesia Care Complications:     No immediate complications. Procedure:         Pre-Anesthesia Assessment:                    - Prior to the procedure, a History and Physical was                     performed, and patient medications, allergies and                     sensitivities were reviewed. The patient's tolerance of                     previous anesthesia was reviewed.                    - The risks and benefits of the procedure and the sedation                     options and risks were discussed with the patient. All                     questions were answered and informed consent was obtained.                    - After reviewing the risks and benefits, the patient was                     deemed in satisfactory condition to undergo the procedure.                    After obtaining informed consent, the colonoscope was                     passed under direct vision. Throughout the procedure, the                     patient's blood pressure, pulse, and oxygen saturations                     were monitored continuously. The Colonoscope was                     introduced through the anus and advanced to the the cecum,                     identified by appendiceal orifice and ileocecal valve. The                     colonoscopy was performed without difficulty. The patient                     tolerated the procedure well. The quality of the bowel                     preparation was good. Findings:  The colon (entire examined portion) appeared  normal. Impression:        - The entire examined colon is normal.                    - No specimens collected. Recommendation:    - Discharge patient to home.                    - Repeat colonoscopy in 10 years for surveillance.                    - The findings and recommendations were discussed with the                     patient. Procedure Code(s): --- Professional ---                    [phone], Colonoscopy, flexible; diagnostic, including                     collection of specimen(s) by brushing or washing, when                     performed (separate procedure) Diagnosis Code(s): --- Professional ---                    Z12.11, Encounter for screening for malignant neoplasm of                     colon CPT copyright 2014 [employer]. All rights reserved. The codes documented in this report are preliminary and upon coder review may  be revised to meet current compliance requirements. Wallace Cullens, MD 12/23/2014 9:58:37 AM This report has been signed electronically. Number of Addenda: 0 Note Initiated On: 12/23/2014 9:35 AM Scope Withdrawal Time: 0 hours 6 minutes 41 seconds  Total Procedure Duration: 0 hours 13 minutes 15 seconds       [facility]

## 2014-12-24 ENCOUNTER — Encounter: Payer: Self-pay | Admitting: Gastroenterology

## 2016-08-31 ENCOUNTER — Other Ambulatory Visit: Payer: Self-pay | Admitting: Orthopedic Surgery

## 2016-08-31 DIAGNOSIS — M25552 Pain in left hip: Secondary | ICD-10-CM

## 2016-09-11 ENCOUNTER — Ambulatory Visit
Admission: RE | Admit: 2016-09-11 | Discharge: 2016-09-11 | Disposition: A | Discharge status: Home / Self Care | Payer: Managed Care, Other (non HMO) | Source: Ambulatory Visit | Attending: Orthopedic Surgery | Admitting: Orthopedic Surgery

## 2016-09-11 DIAGNOSIS — M25552 Pain in left hip: Secondary | ICD-10-CM | POA: Insufficient documentation

## 2022-02-07 ENCOUNTER — Ambulatory Visit (INDEPENDENT_AMBULATORY_CARE_PROVIDER_SITE_OTHER): Payer: 59 | Admitting: Podiatry

## 2022-02-07 DIAGNOSIS — L600 Ingrowing nail: Secondary | ICD-10-CM | POA: Diagnosis not present

## 2022-02-07 MED ORDER — GENTAMICIN SULFATE 0.1 % EX CREA: 1.0000 | TOPICAL_CREAM | Freq: Two times a day (BID) | CUTANEOUS | 1 refills | Status: DC

## 2022-02-07 NOTE — Progress Notes (Signed)
   Chief Complaint  Patient presents with   Ingrown Toenail    left great toenail fell off a yr ago and possible ingrown right corner    Subjective: Patient presents today for evaluation of pain to the medial border left great toe. Patient is concerned for possible ingrown nail.  It is very sensitive to touch.  Patient presents today for further treatment and evaluation.  No past medical history on file.  Objective:  General: Well developed, nourished, in no acute distress, alert and oriented x3   Dermatology: Skin is warm, dry and supple bilateral. Medial border left great toe is tender with evidence of an ingrowing nail. Pain on palpation noted to the border of the nail fold. The remaining nails appear unremarkable at this time. There are no open sores, lesions.  Vascular: DP and PT pulses palpable.  No clinical evidence of vascular compromise  Neruologic: Grossly intact via light touch bilateral.  Musculoskeletal: No pedal deformity noted  Assesement: #1 Paronychia with ingrowing nail medial border left great toe  Plan of Care:  1. Patient evaluated.  2. Discussed treatment alternatives and plan of care. Explained nail avulsion procedure and post procedure course to patient. 3. Patient opted for permanent partial nail avulsion of the ingrown portion of the nail.  4. Prior to procedure, local anesthesia infiltration utilized using 3 ml of a 50:50 mixture of 2% plain lidocaine and 0.5% plain marcaine in a normal hallux block fashion and a betadine prep performed.  5. Partial permanent nail avulsion with chemical matrixectomy performed using 3x30sec applications of phenol followed by alcohol flush.  6. Light dressing applied.  Post care instructions provided 7.  Prescription for gentamicin 2% cream  8.  Return to clinic 2 weeks.  Felecia Shelling, DPM [facility]  Dr. Felecia Shelling, DPM    [address] [place], [place]  [address]                Office [phone]  Fax [phone]

## 2022-02-21 ENCOUNTER — Ambulatory Visit: Payer: 59 | Admitting: Podiatry

## 2022-02-21 DIAGNOSIS — L600 Ingrowing nail: Secondary | ICD-10-CM | POA: Diagnosis not present

## 2022-02-21 NOTE — Progress Notes (Signed)
   Chief Complaint  Patient presents with   nail check    Patient is here for nail check.    Subjective: 56 y.o. female presents today status post permanent nail avulsion procedure of the medial border of left great toe that was performed on 02/07/2022.  Patient states that she has been soaking her foot and applying antibiotic cream as instructed.  No new complaints at this time.   No past medical history on file.  Objective: Neurovascular status intact.  Skin is warm, dry and supple. Nail and respective nail fold appears to be healing appropriately.   Assessment: #1 s/p partial permanent nail matrixectomy medial border left great toe   Plan of care: #1 patient was evaluated  #2 light debridement of the periungual debris was performed to the border of the respective toe and nail plate using a tissue nipper. #3 patient is to return to clinic on a PRN basis.   Felecia Shelling, DPM [facility]  Dr. Felecia Shelling, DPM    [address] [place], [place] [address]                Office [phone]  Fax [phone]

## 2022-08-04 ENCOUNTER — Telehealth: Payer: Self-pay | Admitting: Internal Medicine

## 2022-08-04 NOTE — Telephone Encounter (Signed)
Pt returning [place] call regarding Cardiac History. Pt states that she has never see a Cardiologist before. Please advise

## 2022-08-11 ENCOUNTER — Ambulatory Visit: Payer: Managed Care, Other (non HMO) | Admitting: Internal Medicine

## 2022-08-31 NOTE — Progress Notes (Unsigned)
New Outpatient Visit Date: 09/01/2022  Referring Provider: Juanda Bond, NP [address] [place],  [place] [address]  Chief Complaint: Palpitations  HPI:  [name] is a 57 y.o. female who is being seen today for the evaluation of tachycardia at the request of Ms. Howell. She has a history of hypertension and hypothyroidism.  Today, [name] is concerned about palpitations that first began 8 to 10 years ago.  The first episode occurred while she was driving to her chiropractor and lasted about 20 minutes.  The next episode happened 4 to 5 years later but lasted for about 2 hours.  She had another episode about 4 years ago that lasted for hours.  Most recent episode occurred in 04/2022, which prompted her to contact her PCP during the episode.  She was advised to go to the ED but did not seek medical attention.  There are no associated symptoms besides feeling "wiped out."  This sensation lasts until the next day before resolving.  During the palpitation episodes, she feels like her heart is "coming out of my chest."  She feels off but otherwise has not had any symptoms such as chest pain, shortness of breath, or lightheadedness.  There are no clear precipitants.  She feels well today.  --------------------------------------------------------------------------------------------------  Cardiovascular History & Procedures: Cardiovascular Problems: Palpitations  Risk Factors: Hypertension and prior tobacco use  Cath/PCI: None  CV Surgery: None  EP Procedures and Devices: None  Non-Invasive Evaluation(s): None  --------------------------------------------------------------------------------------------------  Past Medical History:  Diagnosis Date   Hypertension     Past Surgical History:  Procedure Laterality Date   COLONOSCOPY N/A 12/23/2014   Procedure: COLONOSCOPY;  Surgeon: Hulen Luster, MD;  Location: [facility];  Service: Gastroenterology;  Laterality: N/A;   NASAL  SINUS SURGERY     TUBAL LIGATION      Current Meds  Medication Sig   amLODipine (NORVASC) 2.5 MG tablet Take 2.5 mg by mouth daily.   busPIRone (BUSPAR) 15 MG tablet Take 15 mg by mouth 2 (two) times daily as needed.   Cholecalciferol 25 MCG (1000 UT) capsule Take 5,000 Units by mouth 2 (two) times daily.   estradiol (CLIMARA - DOSED IN MG/24 HR) 0.05 mg/24hr patch Place 0.05 mg onto the skin once a week.   Magnesium Oxide 400 MG CAPS Take 4 capsules by mouth at bedtime.   Multiple Vitamin (MULTIVITAMIN) capsule Take 1 capsule by mouth 2 (two) times daily.   progesterone (PROMETRIUM) 200 MG capsule Take 200 mg by mouth at bedtime.   saccharomyces boulardii (FLORASTOR) 250 MG capsule Take 250 mg by mouth 2 (two) times daily.   Testosterone 20 % CREA by Does not apply route.   thyroid (ARMOUR) 130 MG tablet Take 130 mg by mouth 2 (two) times daily.   [DISCONTINUED] Omega-3 Fatty Acids (FISH OIL) 1000 MG CAPS Take by mouth 2 (two) times daily.   [DISCONTINUED] Omega-3 Fatty Acids (FISH OIL) 1000 MG CAPS Take 2 capsules by mouth 2 (two) times daily.    Allergies: Penicillins  Social History   Tobacco Use   Smoking status: Former    Types: Cigarettes    Passive exposure: Past  Vaping Use   Vaping Use: Never used  Substance Use Topics   Alcohol use: Yes    Alcohol/week: 4.0 standard drinks of alcohol    Types: 4 Glasses of wine per week   Drug use: No    Family History  Problem Relation Age of Onset  Heart disease Mother    Peripheral vascular disease Mother    Kidney disease Mother    Heart failure Maternal Grandmother 62    Review of Systems: A 12-system review of systems was performed and was negative except as noted in the HPI.  --------------------------------------------------------------------------------------------------  Physical Exam: BP (!) 134/90 (BP Location: Left Arm, Cuff Size: Normal)   Pulse 76   Ht '5\' 3"'$  (1.6 m)   Wt 148 lb (67.1 kg)   SpO2 95%    BMI 26.22 kg/m  Repeat BP: 134/90  General:  NAD HEENT: No conjunctival pallor or scleral icterus. Neck: Supple without lymphadenopathy, thyromegaly, JVD, or HJR. No carotid bruit. Lungs: Normal work of breathing. Clear to auscultation bilaterally without wheezes or crackles. Heart: Regular rate and rhythm without murmurs, rubs, or gallops. Non-displaced PMI. Abd: Bowel sounds present. Soft, NT/ND without hepatosplenomegaly Ext: No lower extremity edema. Radial, PT, and DP pulses are 2+ bilaterally Skin: Warm and dry without rash. Neuro: CNIII-XII intact. Strength and fine-touch sensation intact in upper and lower extremities bilaterally. Psych: Normal mood and affect.  EKG: Normal sinus rhythm with possible anteroseptal infarct.  Lab Results  Component Value Date   WBC 7.7 09/01/2022   HGB 14.5 09/01/2022   HCT 43.5 09/01/2022   MCV 92.9 09/01/2022   PLT 262 09/01/2022    Lab Results  Component Value Date   NA 135 09/01/2022   K 4.1 09/01/2022   CL 99 09/01/2022   CO2 27 09/01/2022   BUN 18 09/01/2022   CREATININE 0.66 09/01/2022   GLUCOSE 99 09/01/2022   --------------------------------------------------------------------------------------------------  ASSESSMENT AND PLAN: Palpitations, fatigue, and hypothyroidism: Sonya Howell reports a history of infrequent palpitations dating back 8-10 years.  Most recent episode occurred about 4 months ago.  Other than lingering generalized fatigue, there are no associated symptoms.  Physical exam today is unremarkable.  EKG shows possible anteroseptal infarct, though this could be due to lead placement.  Given very infrequent nature of her palpitations, I do not think that ambulatory cardiac monitoring would be of much utility.  I advised her to contact our office or seek immediate medical attention in the ED or by calling 911 if she has continued recurrent sustained palpitations.  If nothing else, this may allow [place] to capture a rhythm  strip/EKG to better define the cause of her palpitations.  We also discussed procuring a wearable device such as an Apple Watch or Kardia mobile device capable of recording telemetry.  In the meantime, I have encouraged Sonya Howell to stay well-hydrated and to minimize caffeine consumption.  We will check a CBC, BMP, magnesium level, and TSH today.  We have also agreed to obtain an echocardiogram.  I recommended discontinuation of fish oil supplements, given limited evidence of beneficial clinical outcomes and association with atrial fibrillation.  Abnormal EKG: EKG today shows sinus rhythm with possible anteroseptal infarct; poor R wave progression could also be due to lead placement.  Nonetheless, with aforementioned palpitations, we have agreed to obtain an echocardiogram to exclude structural abnormalities and evidence of prior infarct.  Hypertension: Blood pressure borderline elevated today with diastolic reading of 90 mmHg.  We discussed lifestyle modification such as sodium restriction.  Continue low-dose amlodipine with ongoing sodium restriction.  Follow-up: Return to clinic in 3 months.  Nelva Bush, MD 09/02/2022 2:05 PM

## 2022-09-01 ENCOUNTER — Ambulatory Visit: Payer: 59 | Attending: Internal Medicine | Admitting: Internal Medicine

## 2022-09-01 ENCOUNTER — Other Ambulatory Visit
Admission: RE | Admit: 2022-09-01 | Discharge: 2022-09-01 | Disposition: A | Discharge status: Home / Self Care | Payer: 59 | Source: Ambulatory Visit | Attending: Internal Medicine | Admitting: Internal Medicine

## 2022-09-01 ENCOUNTER — Encounter: Payer: Self-pay | Admitting: Internal Medicine

## 2022-09-01 VITALS — BP 134/90 | HR 76 | Ht 63.0 in | Wt 148.0 lb

## 2022-09-01 DIAGNOSIS — E038 Other specified hypothyroidism: Secondary | ICD-10-CM | POA: Diagnosis present

## 2022-09-01 DIAGNOSIS — R002 Palpitations: Secondary | ICD-10-CM | POA: Diagnosis not present

## 2022-09-01 DIAGNOSIS — R5383 Other fatigue: Secondary | ICD-10-CM | POA: Insufficient documentation

## 2022-09-01 DIAGNOSIS — E039 Hypothyroidism, unspecified: Secondary | ICD-10-CM | POA: Diagnosis not present

## 2022-09-01 DIAGNOSIS — R9431 Abnormal electrocardiogram [ECG] [EKG]: Secondary | ICD-10-CM | POA: Diagnosis not present

## 2022-09-01 DIAGNOSIS — I10 Essential (primary) hypertension: Secondary | ICD-10-CM

## 2022-09-01 LAB — CBC
HCT: 43.5 % (ref 36.0–46.0)
Hemoglobin: 14.5 g/dL (ref 12.0–15.0)
MCH: 31 pg (ref 26.0–34.0)
MCHC: 33.3 g/dL (ref 30.0–36.0)
MCV: 92.9 fL (ref 80.0–100.0)
Platelets: 262 10*3/uL (ref 150–400)
RBC: 4.68 MIL/uL (ref 3.87–5.11)
RDW: 12.6 % (ref 11.5–15.5)
WBC: 7.7 10*3/uL (ref 4.0–10.5)
nRBC: 0 % (ref 0.0–0.2)

## 2022-09-01 LAB — TSH: TSH: 1.296 u[IU]/mL (ref 0.350–4.500)

## 2022-09-01 LAB — BASIC METABOLIC PANEL
Anion gap: 9 (ref 5–15)
BUN: 18 mg/dL (ref 6–20)
CO2: 27 mmol/L (ref 22–32)
Calcium: 8.4 mg/dL — ABNORMAL LOW (ref 8.9–10.3)
Chloride: 99 mmol/L (ref 98–111)
Creatinine, Ser: 0.66 mg/dL (ref 0.44–1.00)
GFR, Estimated: >60 mL/min (ref >60)
Glucose, Bld: 99 mg/dL (ref 70–99)
Potassium: 4.1 mmol/L (ref 3.5–5.1)
Sodium: 135 mmol/L (ref 135–145)

## 2022-09-01 LAB — MAGNESIUM: Magnesium: 2.5 mg/dL — ABNORMAL HIGH (ref 1.7–2.4)

## 2022-09-01 NOTE — Patient Instructions (Signed)
Medication Instructions:  Your physician recommends the following medication changes.  STOP TAKING: Fish Oil  *If you need a refill on your cardiac medications before your next appointment, please call your pharmacy*   Lab Work: Your provider would like for you to have following labs drawn: (CBC, BMP, Mg, TSH).   Please go to the [facility] entrance and check in at the front desk.  You do not need an appointment.  They are open from 7am-6 pm.   If you have labs (blood work) drawn today and your tests are completely normal, you will receive your results only by: [place] (if you have MyChart) OR A paper copy in the mail If you have any lab test that is abnormal or we need to change your treatment, we will call you to review the results.   Testing/Procedures: Your physician has requested that you have an echocardiogram. Echocardiography is a painless test that uses sound waves to create images of your heart. It provides your doctor with information about the size and shape of your heart and how well your heart's chambers and valves are working.   You may receive an ultrasound enhancing agent through an IV if needed to better visualize your heart during the echo. This procedure takes approximately one hour.  There are no restrictions for this procedure.  This will take place at [facility] ([place] [address], [place]    Follow-Up: At [facility], you and your health needs are our priority.  As part of our continuing mission to provide you with exceptional heart care, we have created designated Provider Care Teams.  These Care Teams include your primary Cardiologist (physician) and Advanced Practice Providers (APPs -  Physician Assistants and Nurse Practitioners) who all work together to provide you with the care you need, when you need it.  We recommend signing up for the patient portal called "MyChart".  Sign up information is  provided on this After Visit Summary.  MyChart is used to connect with patients for Virtual Visits (Telemedicine).  Patients are able to view lab/test results, encounter notes, upcoming appointments, etc.  Non-urgent messages can be sent to your provider as well.   To learn more about what you can do with MyChart, go to [email or web address].    Your next appointment:   3 month(s)  Provider:   Nelva Bush, MD

## 2022-09-02 ENCOUNTER — Encounter: Payer: Self-pay | Admitting: Internal Medicine

## 2022-09-02 DIAGNOSIS — E039 Hypothyroidism, unspecified: Secondary | ICD-10-CM | POA: Insufficient documentation

## 2022-09-02 DIAGNOSIS — I10 Essential (primary) hypertension: Secondary | ICD-10-CM | POA: Insufficient documentation

## 2022-09-02 DIAGNOSIS — R002 Palpitations: Secondary | ICD-10-CM | POA: Insufficient documentation

## 2022-09-02 DIAGNOSIS — R5383 Other fatigue: Secondary | ICD-10-CM | POA: Insufficient documentation

## 2022-10-10 ENCOUNTER — Telehealth (INDEPENDENT_AMBULATORY_CARE_PROVIDER_SITE_OTHER): Payer: Self-pay

## 2022-10-18 ENCOUNTER — Ambulatory Visit: Payer: 59 | Attending: Internal Medicine

## 2022-10-18 DIAGNOSIS — R9431 Abnormal electrocardiogram [ECG] [EKG]: Secondary | ICD-10-CM

## 2022-10-18 LAB — ECHOCARDIOGRAM COMPLETE
AR max vel: 2.69 cm2
AV Area VTI: 2.78 cm2
AV Area mean vel: 2.52 cm2
AV Mean grad: 2 mmHg
AV Peak grad: 3.4 mmHg
Ao pk vel: 0.92 m/s
Area-P 1/2: 5.66 cm2
S' Lateral: 2.9 cm

## 2022-10-20 ENCOUNTER — Telehealth: Payer: Self-pay | Admitting: Internal Medicine

## 2022-10-20 NOTE — Telephone Encounter (Signed)
Patient is returning call.  °

## 2022-10-23 NOTE — Telephone Encounter (Signed)
Pt returning call for Echo results  Best number [phone]

## 2022-10-23 NOTE — Telephone Encounter (Signed)
Left voicemail message to call back for review of results.  

## 2022-10-23 NOTE — Telephone Encounter (Signed)
Spoke with patient and reviewed her results and recommendations. She verbalized understanding with no further questions at this time.  

## 2022-12-06 ENCOUNTER — Ambulatory Visit: Payer: 59 | Admitting: Internal Medicine

## 2023-02-01 ENCOUNTER — Encounter: Payer: Self-pay | Admitting: Internal Medicine

## 2023-02-01 ENCOUNTER — Ambulatory Visit: Payer: 59 | Attending: Internal Medicine | Admitting: Internal Medicine

## 2023-02-01 VITALS — BP 146/90 | HR 80 | Ht 63.0 in | Wt 144.8 lb

## 2023-02-01 DIAGNOSIS — I10 Essential (primary) hypertension: Secondary | ICD-10-CM

## 2023-02-01 DIAGNOSIS — I7121 Aneurysm of the ascending aorta, without rupture: Secondary | ICD-10-CM

## 2023-02-01 DIAGNOSIS — R002 Palpitations: Secondary | ICD-10-CM | POA: Diagnosis not present

## 2023-02-01 DIAGNOSIS — E7800 Pure hypercholesterolemia, unspecified: Secondary | ICD-10-CM | POA: Insufficient documentation

## 2023-02-01 NOTE — Patient Instructions (Signed)
Medication Instructions:  Your physician recommends that you continue on your current medications as directed. Please refer to the Current Medication list given to you today.    Lab Work: Your provider would like for you to return in October, 2024 (Prior to CTA) to have the following labs drawn: CMP, Lipid.   Please go to [facility] [address] [address], [place] [address] You do not need an appointment.  They are open from 7am-6 pm.  You will need to be fasting.    Testing/Procedures: CT Angiography (CTA) chest/abdominal/pelvis in October, 2024, is a special type of CT scan that uses a computer to produce multi-dimensional views of major blood vessels throughout the body. In CT angiography, a contrast material is injected through an IV to help visualize the blood vessels  Nothing to eat or drink 6 hours prior to test  [facility] [address]  [place], [place] [address]     Follow-Up: At [facility], you and your health needs are our priority.  As part of our continuing mission to provide you with exceptional heart care, we have created designated Provider Care Teams.  These Care Teams include your primary Cardiologist (physician) and Advanced Practice Providers (APPs -  Physician Assistants and Nurse Practitioners) who all work together to provide you with the care you need, when you need it.  We recommend signing up for the patient portal called "MyChart".  Sign up information is provided on this After Visit Summary.  MyChart is used to connect with patients for Virtual Visits (Telemedicine).  Patients are able to view lab/test results, encounter notes, upcoming appointments, etc.  Non-urgent messages can be sent to your provider as well.   To learn more about what you can do with MyChart, go to [email or web address].    Your next appointment:   6 month(s)  Provider:   You may see  Yvonne Kendall, MD or one of the following Advanced Practice Providers on your designated Care Team:   Nicolasa Ducking, NP Eula Listen, PA-C Cadence Fransico Michael, PA-C Charlsie Quest, NP

## 2023-02-01 NOTE — Progress Notes (Signed)
Cardiology Office Note:  .   Date:  02/01/2023  ID:  Sonya Howell, DOB Jan 04, 1966, MRN [id number] PCP: Sonya Browns, NP   HeartCare Providers Cardiologist:  Yvonne Kendall, MD     History of Present Illness: .   Sonya Howell is a 57 y.o. female with history of enlarged ascending aorta, hypertension, and hypothyroidism, presenting for follow-up of palpitations.  I met her in February, at which time she reported sporadic palpitations dating back several years.  She also complained of generalized fatigue.  Given infrequent nature of her palpitations, we agreed to defer ambulatory cardiac monitoring.  Echocardiogram was performed due to EKG showing possible anteroseptal infarct.  Mildly dilated ascending aorta measuring 3.9 cm was noted.  Otherwise, the study was normal.  Labs at our visit in February were also unremarkable.  Today, Sonya Howell reports that she has been feeling fairly well without any further palpitations.  She also denies chest pain, shortness of breath, and lightheadedness.  She sometimes feels like she holds her breath when she is stressed.  She reports home BP's have been running a bit high recently, up to 158 mmHg systolic yesterday.  This prompted her to take an additional dose of amlodipine 2.5 mg daily.  ROS: Sonya Howell reports occasional hot and cold sensation along the lateral aspect of the right thigh.  She is concerned that this could be a vascular issue, as her mother had an aortic aneurysm.  Studies Reviewed: Marland Kitchen        TTE (10/18/2022): Normal LV size and wall thickness.  LVEF 55-60% with normal wall motion and diastolic parameters.  Normal RV size and function.  Normal biatrial size.  Trivial mitral regurgitation.  Mild tricuspid regurgitation.  Normal aortic valve.  Mildly dilated ascending aorta measuring 3.9 cm. Risk Assessment/Calculations:     HYPERTENSION CONTROL Vitals:   02/01/23 0834 02/01/23 0850  BP: (!) 140/96 (!) 146/90    The patient's  blood pressure is elevated above target today.  In order to address the patient's elevated BP: A current anti-hypertensive medication was adjusted today.       Physical Exam:   VS:  BP (!) 146/90   Pulse 80   Ht 5\' 3"  (1.6 m)   Wt 144 lb 12.8 oz (65.7 kg)   SpO2 98%   BMI 25.65 kg/m    Wt Readings from Last 3 Encounters:  02/01/23 144 lb 12.8 oz (65.7 kg)  09/01/22 148 lb (67.1 kg)  12/23/14 144 lb (65.3 kg)    General:  NAD. Neck: No JVD or HJR. Lungs: Clear to auscultation bilaterally without wheezes or crackles. Heart: Regular rate and rhythm without murmurs, rubs, or gallops. Abdomen: Soft, nontender, nondistended. Extremities: No lower extremity edema.  ASSESSMENT AND PLAN: .    Palpitations: No recurrence reported.  Given lack of structural heart disease on recent echo and infrequent nature of palpitations (happen every few years), we will defer additional testing at this time.  Ascending aortic aneurysm and hyperlipidemia: No symptoms reported.  Mild dilation of ascending aorta noted on recent echo.  Sonya Howell is also concerned because of paresthesias in the right thigh (more consistent with neuropathic etiology) and mother's history of AAA.  Given her dilated thoracic aorta and history of tobacco use, we will obtain a CTA chest, abdomen, and pelvis in April (~6 months from the time of her echo).  We will check a CMP and lipid panel for risk stratification prior to the scan.  [name] is reluctant to consider a statin; though this will need to be readdressed if her LDL has risen since 2021, when it was 113.  Importance of blood pressure control was reinforced.  Hypertension: BP suboptimally controlled today.  We have agreed to increase amlodipine to 2.5 mg twice daily.    Dispo: Return to clinic in 6 months.  Signed, Yvonne Kendall, MD

## 2023-04-10 ENCOUNTER — Other Ambulatory Visit: Payer: Self-pay | Admitting: Family Medicine

## 2023-04-10 DIAGNOSIS — E063 Autoimmune thyroiditis: Secondary | ICD-10-CM

## 2023-04-16 ENCOUNTER — Ambulatory Visit
Admission: RE | Admit: 2023-04-16 | Discharge: 2023-04-16 | Disposition: A | Discharge status: Home / Self Care | Payer: 59 | Source: Ambulatory Visit | Attending: Internal Medicine | Admitting: Internal Medicine

## 2023-04-16 DIAGNOSIS — I7121 Aneurysm of the ascending aorta, without rupture: Secondary | ICD-10-CM | POA: Insufficient documentation

## 2023-04-16 LAB — POCT I-STAT CREATININE: Creatinine, Ser: 0.7 mg/dL (ref 0.44–1.00)

## 2023-04-16 MED ORDER — IOHEXOL 350 MG/ML SOLN
100.0000 mL | Freq: Once | INTRAVENOUS | Status: AC | PRN
  Administered 2023-04-16: 100 mL via INTRAVENOUS

## 2023-04-19 ENCOUNTER — Ambulatory Visit
Admission: RE | Admit: 2023-04-19 | Discharge: 2023-04-19 | Disposition: A | Discharge status: Home / Self Care | Payer: 59 | Source: Ambulatory Visit | Attending: Family Medicine | Admitting: Family Medicine

## 2023-04-19 DIAGNOSIS — E063 Autoimmune thyroiditis: Secondary | ICD-10-CM | POA: Insufficient documentation

## 2023-09-11 DIAGNOSIS — H900 Conductive hearing loss, bilateral: Secondary | ICD-10-CM | POA: Diagnosis not present

## 2023-09-11 DIAGNOSIS — H903 Sensorineural hearing loss, bilateral: Secondary | ICD-10-CM | POA: Diagnosis not present

## 2023-09-11 DIAGNOSIS — J3489 Other specified disorders of nose and nasal sinuses: Secondary | ICD-10-CM | POA: Diagnosis not present

## 2023-09-17 DIAGNOSIS — E063 Autoimmune thyroiditis: Secondary | ICD-10-CM | POA: Diagnosis not present

## 2023-09-17 DIAGNOSIS — E7800 Pure hypercholesterolemia, unspecified: Secondary | ICD-10-CM | POA: Diagnosis not present

## 2023-09-17 DIAGNOSIS — R6889 Other general symptoms and signs: Secondary | ICD-10-CM | POA: Diagnosis not present

## 2023-09-17 DIAGNOSIS — F419 Anxiety disorder, unspecified: Secondary | ICD-10-CM | POA: Diagnosis not present

## 2023-09-17 DIAGNOSIS — Z780 Asymptomatic menopausal state: Secondary | ICD-10-CM | POA: Diagnosis not present

## 2023-12-07 DIAGNOSIS — Z124 Encounter for screening for malignant neoplasm of cervix: Secondary | ICD-10-CM | POA: Diagnosis not present

## 2023-12-11 ENCOUNTER — Other Ambulatory Visit: Payer: Self-pay | Admitting: Family Medicine

## 2023-12-11 DIAGNOSIS — N939 Abnormal uterine and vaginal bleeding, unspecified: Secondary | ICD-10-CM

## 2023-12-19 ENCOUNTER — Ambulatory Visit
Admission: RE | Admit: 2023-12-19 | Discharge: 2023-12-19 | Disposition: A | Discharge status: Home / Self Care | Payer: Self-pay | Source: Ambulatory Visit | Attending: Family Medicine | Admitting: Family Medicine

## 2023-12-19 DIAGNOSIS — N939 Abnormal uterine and vaginal bleeding, unspecified: Secondary | ICD-10-CM | POA: Diagnosis not present

## 2023-12-19 DIAGNOSIS — R9389 Abnormal findings on diagnostic imaging of other specified body structures: Secondary | ICD-10-CM | POA: Diagnosis not present

## 2023-12-19 DIAGNOSIS — Z780 Asymptomatic menopausal state: Secondary | ICD-10-CM | POA: Diagnosis not present

## 2024-01-23 ENCOUNTER — Encounter: Payer: Self-pay | Admitting: *Deleted

## 2024-01-31 ENCOUNTER — Encounter: Payer: Self-pay | Admitting: Internal Medicine

## 2024-01-31 ENCOUNTER — Ambulatory Visit: Attending: Internal Medicine | Admitting: Internal Medicine

## 2024-01-31 VITALS — BP 115/68 | HR 90 | Ht 63.0 in | Wt 145.6 lb

## 2024-01-31 DIAGNOSIS — I77810 Thoracic aortic ectasia: Secondary | ICD-10-CM | POA: Diagnosis not present

## 2024-01-31 DIAGNOSIS — R002 Palpitations: Secondary | ICD-10-CM

## 2024-01-31 DIAGNOSIS — I7121 Aneurysm of the ascending aorta, without rupture: Secondary | ICD-10-CM

## 2024-01-31 DIAGNOSIS — I700 Atherosclerosis of aorta: Secondary | ICD-10-CM

## 2024-01-31 DIAGNOSIS — E785 Hyperlipidemia, unspecified: Secondary | ICD-10-CM

## 2024-01-31 NOTE — Progress Notes (Unsigned)
  Cardiology Office Note:  .   Date:  01/31/2024  ID:  Sonya Howell, DOB 1966-01-06, MRN [id number] PCP: Narvis Fees, NP  [facility] Providers Cardiologist:  Lonni Hanson, MD { Click to update primary MD,subspecialty MD or APP then REFRESH:1}    History of Present Illness: .   Sonya Howell is a 58 y.o. female with history of ascending aortic aneurysm, hypertension, and hypothyroidism, presenting for follow-up of palpitations.  I last saw her a year ago, at which time she was feeling well without further palpitations.  She was concerned about paresthesias in her right thigh (felt to be consistent with neuropathy) and her mother's history of AAA, bleeding asked to perform a CTA of the chest, abdomen, and pelvis.  This showed mildly ectatic but nonaneurysmal ascending thoracic aorta measuring up to 3.7 cm.  No significant abnormality was identified in the abdomen or pelvis.  Exercisign; has changed diet.  BP 115-120/60-80.  Still on amlodipine.  No CP or shortness of breath.  Rare skipped beats wtihout assoc sx.  Mild LE only at the beach.  ROS: See HPI  Studies Reviewed: SABRA   EKG Interpretation Date/Time:  Thursday January 31 2024 09:38:01 EDT Ventricular Rate:  90 PR Interval:  150 QRS Duration:  66 QT Interval:  362 QTC Calculation: 442 R Axis:   -23  Text Interpretation: Normal sinus rhythm Possible Left atrial enlargement Possible Septal infarct , age undetermined versus lead placement Borderline ECG When compared with ECG of 01-Sep-2022 No significant change was found Confirmed by Makailee Nudelman, Lonni [id number]) on 01/31/2024 9:42:00 AM    *** Risk Assessment/Calculations:   {Does this patient have ATRIAL FIBRILLATION?:[phone]}         Physical Exam:   VS:  BP 115/68 (BP Location: Left Arm, Patient Position: Sitting, Cuff Size: Normal)   Pulse 90   Ht 5' 3 (1.6 m)   Wt 145 lb 9.6 oz (66 kg)   SpO2 98%   BMI 25.79 kg/m    Wt Readings from Last 3 Encounters:   01/31/24 145 lb 9.6 oz (66 kg)  02/01/23 144 lb 12.8 oz (65.7 kg)  09/01/22 148 lb (67.1 kg)    General:  NAD. Neck: No JVD or HJR. Lungs: Clear to auscultation bilaterally without wheezes or crackles. Heart: Regular rate and rhythm without murmurs, rubs, or gallops. Abdomen: Soft, nontender, nondistended. Extremities: No lower extremity edema.  ASSESSMENT AND PLAN: .    ***    {Are you ordering a CV Procedure (e.g. stress test, cath, DCCV, TEE, etc)?   Press F2        :[id number]}  Dispo: ***  Signed, Lonni Hanson, MD

## 2024-01-31 NOTE — Patient Instructions (Signed)
 Medication Instructions:  Your physician recommends that you continue on your current medications as directed. Please refer to the Current Medication list given to you today.    *If you need a refill on your cardiac medications before your next appointment, please call your pharmacy*  Lab Work: No labs ordered today    Testing/Procedures: No test ordered today   Follow-Up: At [facility], you and your health needs are our priority.  As part of our continuing mission to provide you with exceptional heart care, our providers are all part of one team.  This team includes your primary Cardiologist (physician) and Advanced Practice Providers or APPs (Physician Assistants and Nurse Practitioners) who all work together to provide you with the care you need, when you need it.  Your next appointment:   2 year(s)  Provider:   You may see Lonni Hanson, MD or one of the following Advanced Practice Providers on your designated Care Team:   Lonni Meager, NP Lesley Maffucci, PA-C Bernardino Bring, PA-C Cadence Vina, PA-C Tylene Lunch, NP Barnie Hila, NP

## 2024-02-01 ENCOUNTER — Telehealth: Payer: Self-pay | Admitting: Internal Medicine

## 2024-02-01 ENCOUNTER — Encounter: Payer: Self-pay | Admitting: Internal Medicine

## 2024-02-01 DIAGNOSIS — I700 Atherosclerosis of aorta: Secondary | ICD-10-CM | POA: Insufficient documentation

## 2024-02-01 DIAGNOSIS — E785 Hyperlipidemia, unspecified: Secondary | ICD-10-CM | POA: Insufficient documentation

## 2024-02-01 NOTE — Telephone Encounter (Signed)
 Caller Jesusa) stated she was returning RN Alisha's call regarding lab orders.

## 2024-02-01 NOTE — Telephone Encounter (Signed)
 Received a call from the patient's PCP office reporting that the patient's last lipid panel was drawn in October 2024. The nurse attempted to contact the patient to confirm where the labs were last drawn. A message was left requesting a call back.

## 2024-02-21 DIAGNOSIS — L292 Pruritus vulvae: Secondary | ICD-10-CM | POA: Diagnosis not present

## 2024-02-21 DIAGNOSIS — Z79890 Hormone replacement therapy (postmenopausal): Secondary | ICD-10-CM | POA: Diagnosis not present

## 2024-03-13 DIAGNOSIS — N950 Postmenopausal bleeding: Secondary | ICD-10-CM | POA: Diagnosis not present

## 2024-06-04 DIAGNOSIS — Z0000 Encounter for general adult medical examination without abnormal findings: Secondary | ICD-10-CM | POA: Diagnosis not present

## 2024-06-04 DIAGNOSIS — E039 Hypothyroidism, unspecified: Secondary | ICD-10-CM | POA: Diagnosis not present

## 2024-06-26 DIAGNOSIS — D225 Melanocytic nevi of trunk: Secondary | ICD-10-CM | POA: Diagnosis not present

## 2024-06-26 DIAGNOSIS — D2262 Melanocytic nevi of left upper limb, including shoulder: Secondary | ICD-10-CM | POA: Diagnosis not present

## 2024-06-26 DIAGNOSIS — L570 Actinic keratosis: Secondary | ICD-10-CM | POA: Diagnosis not present

## 2024-06-26 DIAGNOSIS — D2272 Melanocytic nevi of left lower limb, including hip: Secondary | ICD-10-CM | POA: Diagnosis not present

## 2024-06-26 DIAGNOSIS — D2261 Melanocytic nevi of right upper limb, including shoulder: Secondary | ICD-10-CM | POA: Diagnosis not present
